# Patient Record
Sex: Female | Born: 1950 | Race: Black or African American | Hispanic: No | Marital: Married | Smoking: Never smoker
Health system: Southern US, Community
[De-identification: ages and names within clinical notes are randomized; demographics above are authoritative.]

## PROBLEM LIST (undated history)

## (undated) DIAGNOSIS — T7840XA Allergy, unspecified, initial encounter: Secondary | ICD-10-CM

## (undated) DIAGNOSIS — J189 Pneumonia, unspecified organism: Secondary | ICD-10-CM

## (undated) DIAGNOSIS — N200 Calculus of kidney: Secondary | ICD-10-CM

## (undated) DIAGNOSIS — R519 Headache, unspecified: Secondary | ICD-10-CM

## (undated) DIAGNOSIS — M1990 Unspecified osteoarthritis, unspecified site: Secondary | ICD-10-CM

## (undated) DIAGNOSIS — G8929 Other chronic pain: Secondary | ICD-10-CM

## (undated) DIAGNOSIS — D649 Anemia, unspecified: Secondary | ICD-10-CM

## (undated) DIAGNOSIS — I10 Essential (primary) hypertension: Secondary | ICD-10-CM

## (undated) HISTORY — DX: Allergy, unspecified, initial encounter: T78.40XA

## (undated) HISTORY — DX: Anemia, unspecified: D64.9

## (undated) HISTORY — DX: Headache, unspecified: R51.9

## (undated) HISTORY — DX: Other chronic pain: G89.29

## (undated) HISTORY — DX: Essential (primary) hypertension: I10

## (undated) HISTORY — DX: Pneumonia, unspecified organism: J18.9

## (undated) HISTORY — DX: Unspecified osteoarthritis, unspecified site: M19.90

## (undated) HISTORY — DX: Calculus of kidney: N20.0

## (undated) HISTORY — PX: COLONOSCOPY: SHX174

---

## 2001-04-18 ENCOUNTER — Encounter: Payer: Self-pay | Admitting: Internal Medicine

## 2003-06-10 HISTORY — PX: KIDNEY STONE SURGERY: SHX686

## 2003-07-11 HISTORY — PX: MYOMECTOMY: SHX85

## 2003-08-28 ENCOUNTER — Other Ambulatory Visit: Payer: Self-pay

## 2005-06-28 ENCOUNTER — Ambulatory Visit: Payer: Self-pay | Admitting: Internal Medicine

## 2005-07-28 ENCOUNTER — Ambulatory Visit: Payer: Self-pay | Admitting: Internal Medicine

## 2005-07-28 ENCOUNTER — Other Ambulatory Visit: Admission: RE | Admit: 2005-07-28 | Discharge: 2005-07-28 | Payer: Self-pay | Admitting: Internal Medicine

## 2005-08-10 ENCOUNTER — Ambulatory Visit: Payer: Self-pay | Admitting: Internal Medicine

## 2005-09-29 ENCOUNTER — Ambulatory Visit: Payer: Self-pay | Admitting: Internal Medicine

## 2006-04-18 ENCOUNTER — Ambulatory Visit: Payer: Self-pay | Admitting: Internal Medicine

## 2006-08-01 ENCOUNTER — Ambulatory Visit: Payer: Self-pay | Admitting: Internal Medicine

## 2006-08-01 LAB — CONVERTED CEMR LAB
Albumin: 3.8 g/dL (ref 3.5–5.2)
BUN: 10 mg/dL (ref 6–23)
CO2: 28 meq/L (ref 19–32)
Calcium: 9.2 mg/dL (ref 8.4–10.5)
Chloride: 105 meq/L (ref 96–112)
Creatinine, Ser: 0.9 mg/dL (ref 0.4–1.2)
GFR calc Af Amer: 84 mL/min
GFR calc non Af Amer: 70 mL/min
Glucose, Bld: 90 mg/dL (ref 70–99)
Phosphorus: 3.5 mg/dL (ref 2.3–4.6)
Potassium: 3.9 meq/L (ref 3.5–5.1)
Sodium: 139 meq/L (ref 135–145)
TSH: 1.13 microintl units/mL (ref 0.35–5.50)

## 2006-08-14 ENCOUNTER — Ambulatory Visit: Payer: Self-pay | Admitting: Internal Medicine

## 2006-10-17 ENCOUNTER — Ambulatory Visit: Payer: Self-pay | Admitting: Internal Medicine

## 2006-10-17 LAB — HM MAMMOGRAPHY: HM Mammogram: NORMAL

## 2007-04-15 ENCOUNTER — Ambulatory Visit: Payer: Self-pay | Admitting: Internal Medicine

## 2007-04-15 DIAGNOSIS — J309 Allergic rhinitis, unspecified: Secondary | ICD-10-CM | POA: Insufficient documentation

## 2007-04-15 DIAGNOSIS — J4520 Mild intermittent asthma, uncomplicated: Secondary | ICD-10-CM

## 2007-04-16 ENCOUNTER — Telehealth (INDEPENDENT_AMBULATORY_CARE_PROVIDER_SITE_OTHER): Payer: Self-pay | Admitting: *Deleted

## 2007-05-22 ENCOUNTER — Ambulatory Visit: Payer: Self-pay | Admitting: Internal Medicine

## 2007-09-19 ENCOUNTER — Encounter: Payer: Self-pay | Admitting: Internal Medicine

## 2007-09-25 ENCOUNTER — Ambulatory Visit: Payer: Self-pay | Admitting: Internal Medicine

## 2007-09-26 LAB — CONVERTED CEMR LAB: Glucose, Bld: 93 mg/dL (ref 70–99)

## 2007-10-11 ENCOUNTER — Ambulatory Visit: Payer: Self-pay | Admitting: Internal Medicine

## 2007-10-11 LAB — CONVERTED CEMR LAB
OCCULT 1: NEGATIVE
OCCULT 3: NEGATIVE

## 2007-10-14 ENCOUNTER — Encounter (INDEPENDENT_AMBULATORY_CARE_PROVIDER_SITE_OTHER): Payer: Self-pay | Admitting: *Deleted

## 2007-10-16 ENCOUNTER — Encounter: Payer: Self-pay | Admitting: Internal Medicine

## 2007-10-24 ENCOUNTER — Encounter (INDEPENDENT_AMBULATORY_CARE_PROVIDER_SITE_OTHER): Payer: Self-pay | Admitting: *Deleted

## 2008-10-26 ENCOUNTER — Encounter: Payer: Self-pay | Admitting: Internal Medicine

## 2008-10-26 ENCOUNTER — Other Ambulatory Visit: Admission: RE | Admit: 2008-10-26 | Discharge: 2008-10-26 | Payer: Self-pay | Admitting: Internal Medicine

## 2008-10-26 ENCOUNTER — Ambulatory Visit: Payer: Self-pay | Admitting: Internal Medicine

## 2008-10-26 DIAGNOSIS — R079 Chest pain, unspecified: Secondary | ICD-10-CM

## 2008-10-30 ENCOUNTER — Encounter: Payer: Self-pay | Admitting: Internal Medicine

## 2008-11-03 ENCOUNTER — Ambulatory Visit: Payer: Self-pay | Admitting: Internal Medicine

## 2008-11-11 ENCOUNTER — Encounter: Payer: Self-pay | Admitting: Internal Medicine

## 2008-11-16 ENCOUNTER — Encounter: Payer: Self-pay | Admitting: Internal Medicine

## 2009-05-18 ENCOUNTER — Ambulatory Visit: Payer: Self-pay | Admitting: Internal Medicine

## 2009-08-11 ENCOUNTER — Ambulatory Visit: Payer: Self-pay | Admitting: Internal Medicine

## 2009-08-11 DIAGNOSIS — I10 Essential (primary) hypertension: Secondary | ICD-10-CM

## 2009-08-25 ENCOUNTER — Ambulatory Visit: Payer: Self-pay | Admitting: Cardiology

## 2009-08-25 ENCOUNTER — Ambulatory Visit: Payer: Self-pay

## 2009-08-25 ENCOUNTER — Ambulatory Visit (HOSPITAL_COMMUNITY): Admission: RE | Admit: 2009-08-25 | Discharge: 2009-08-25 | Payer: Self-pay | Admitting: Internal Medicine

## 2009-08-25 ENCOUNTER — Encounter: Payer: Self-pay | Admitting: Internal Medicine

## 2009-09-01 ENCOUNTER — Ambulatory Visit: Payer: Self-pay | Admitting: Internal Medicine

## 2009-09-02 LAB — CONVERTED CEMR LAB
Creatinine, Ser: 0.9 mg/dL (ref 0.4–1.2)
GFR calc non Af Amer: 83.02 mL/min (ref >60)
Glucose, Bld: 84 mg/dL (ref 70–99)
Sodium: 138 meq/L (ref 135–145)

## 2009-11-10 ENCOUNTER — Ambulatory Visit: Payer: Self-pay | Admitting: Internal Medicine

## 2009-11-10 DIAGNOSIS — J019 Acute sinusitis, unspecified: Secondary | ICD-10-CM

## 2009-11-10 DIAGNOSIS — L570 Actinic keratosis: Secondary | ICD-10-CM

## 2009-11-15 ENCOUNTER — Telehealth: Payer: Self-pay | Admitting: Internal Medicine

## 2009-11-24 ENCOUNTER — Ambulatory Visit: Payer: Self-pay | Admitting: Internal Medicine

## 2009-11-25 ENCOUNTER — Encounter: Payer: Self-pay | Admitting: Internal Medicine

## 2009-11-25 LAB — CONVERTED CEMR LAB: Fecal Occult Bld: NEGATIVE

## 2010-04-20 ENCOUNTER — Ambulatory Visit: Payer: Self-pay | Admitting: Internal Medicine

## 2010-08-07 LAB — CONVERTED CEMR LAB
ALT: 16 units/L (ref 0–35)
Albumin: 4.2 g/dL (ref 3.5–5.2)
Alkaline Phosphatase: 82 units/L (ref 39–117)
Alkaline Phosphatase: 87 units/L (ref 39–117)
BUN: 11 mg/dL (ref 6–23)
Basophils Absolute: 0 10*3/uL (ref 0.0–0.1)
Basophils Relative: 0.5 % (ref 0.0–3.0)
Bilirubin, Direct: 0 mg/dL (ref 0.0–0.3)
Bilirubin, Direct: 0.1 mg/dL (ref 0.0–0.3)
CO2: 28 meq/L (ref 19–32)
CO2: 31 meq/L (ref 19–32)
Calcium: 9.2 mg/dL (ref 8.4–10.5)
Calcium: 9.4 mg/dL (ref 8.4–10.5)
Chloride: 106 meq/L (ref 96–112)
Creatinine, Ser: 0.8 mg/dL (ref 0.4–1.2)
Creatinine,U: 144.9 mg/dL
Direct LDL: 128.7 mg/dL
Eosinophils Absolute: 0.1 10*3/uL (ref 0.0–0.7)
Eosinophils Relative: 4.4 % (ref 0.0–5.0)
Glucose, Bld: 76 mg/dL (ref 70–99)
Glucose, Bld: 84 mg/dL (ref 70–99)
Hemoglobin: 12.9 g/dL (ref 12.0–15.0)
Lymphocytes Relative: 30.4 % (ref 12.0–46.0)
MCHC: 34 g/dL (ref 30.0–36.0)
MCV: 79.4 fL (ref 78.0–100.0)
MCV: 83.1 fL (ref 78.0–100.0)
Microalb, Ur: 1 mg/dL (ref 0.0–1.9)
Monocytes Absolute: 0.2 10*3/uL (ref 0.1–1.0)
Monocytes Absolute: 0.2 10*3/uL (ref 0.1–1.0)
Neutro Abs: 1.7 10*3/uL (ref 1.4–7.7)
Neutrophils Relative %: 43.6 % (ref 43.0–77.0)
Neutrophils Relative %: 63.5 % (ref 43.0–77.0)
Pap Smear: NORMAL
Platelets: 170 10*3/uL (ref 150.0–400.0)
Potassium: 3.9 meq/L (ref 3.5–5.1)
RBC: 4.74 M/uL (ref 3.87–5.11)
Sodium: 142 meq/L (ref 135–145)
Sodium: 143 meq/L (ref 135–145)
TSH: 0.96 microintl units/mL (ref 0.35–5.50)
Total Bilirubin: 0.7 mg/dL (ref 0.3–1.2)
Total Protein: 8.3 g/dL (ref 6.0–8.3)
Triglycerides: 117 mg/dL (ref 0.0–149.0)
WBC: 3.9 10*3/uL — ABNORMAL LOW (ref 4.5–10.5)

## 2010-08-11 NOTE — Assessment & Plan Note (Signed)
Summary: 6 M F/U DLO   Vital Signs:  Patient profile:   60 year old female Weight:      214 pounds Temp:     98.1 degrees F oral Pulse rate:   80 / minute Pulse rhythm:   regular BP sitting:   112 / 80  (right arm) Cuff size:   large  Vitals Entered By: Lowella Petties CMA (April 20, 2010 10:47 AM) CC: 6 month follow up   History of Present Illness: Feels okay  Has had increased allergy symptoms due to ragweed Now uses both antihistamines and nasal spray this controls the ear and nasal congestion  Hasn't had problems with asthma Occ wheezing and chest aching when the weather changes Generally does okay when she gets back into the warmth hasn't needed the resuce inhaler  checks BP occ Last time 116/85 HR was 109---while working Occ headaches--relates to allergies No chest pain  No SOB  Allergies: 1)  ! Penicillin 2)  ! Ampicillin 3)  ! * Eryc  Past History:  Past medical, surgical, family and social histories (including risk factors) reviewed for relevance to current acute and chronic problems.  Past Medical History: Reviewed history from 08/11/2009 and no changes required. Allergic rhinitis Asthma Hypertension  Past Surgical History: Reviewed history from 09/19/2007 and no changes required. C-section Uterine fibroids removed ([facility]) 2005 Kidney stones 12/04  Family History: Reviewed history from 10/26/2008 and no changes required. Dad died in late 70's CHF Mom with dementia, HTN, DM, ?breast cancer  Social History: Reviewed history from 09/25/2007 and no changes required. Marital Status: Married Children: One daughter works at [employer] now  Never Smoked Alcohol use-no  Review of Systems       generally sleeps okay--occ up with mom appetite fine weight down 3#  Physical Exam  General:  alert and normal appearance.   Nose:  moderate pale turbinate congestion Mouth:  no erythema and no exudates.   Neck:  supple, no masses, no  thyromegaly, no carotid bruits, and no cervical lymphadenopathy.   Lungs:  normal respiratory effort, no intercostal retractions, no accessory muscle use, normal breath sounds, no crackles, and no wheezes.   Heart:  normal rate, regular rhythm, no murmur, and no gallop.   Extremities:  no edema Psych:  normally interactive, good eye contact, not anxious appearing, and not depressed appearing.     Impression & Recommendations:  Problem # 1:  HYPERTENSION (ICD-401.9) Assessment Unchanged good control no changes  Her updated medication list for this problem includes:    Triamterene-hctz 37.5-25 Mg Tabs (Triamterene-hctz) .Marland Kitchen... 1 tab daily for high blood pressure  BP today: 112/80 Prior BP: 130/80 (11/10/2009)  Labs Reviewed: K+: 4.1 (09/01/2009) Creat: : 0.9 (09/01/2009)   Chol: 205 (08/11/2009)   HDL: 60.30 (08/11/2009)   TG: 117.0 (08/11/2009)  Problem # 2:  ASTHMA (ICD-493.90) Assessment: Unchanged quiet despite the allergy season  Her updated medication list for this problem includes:    Albuterol 90 Mcg/act Aers (Albuterol) .Marland Kitchen... As needed  Problem # 3:  ALLERGIC RHINITIS (ICD-477.9) Assessment: Unchanged doing okay on  the regimen continue for now then back off after season ends  Her updated medication list for this problem includes:    Cetirizine Hcl 10 Mg Tabs (Cetirizine hcl) .Marland Kitchen... Take 2 by mouth once daily    Fluticasone Propionate 50 Mcg/act Susp (Fluticasone propionate) .Marland Kitchen... 2 sprays in each nostril daily    Loratadine 10 Mg Tabs (Loratadine) .Marland Kitchen... Take 1 by mouth once daily  Complete Medication List: 1)  Triamterene-hctz 37.5-25 Mg Tabs (Triamterene-hctz) .Marland Kitchen.. 1 tab daily for high blood pressure 2)  Albuterol 90 Mcg/act Aers (Albuterol) .... As needed 3)  Cetirizine Hcl 10 Mg Tabs (Cetirizine hcl) .... Take 2 by mouth once daily 4)  Fluticasone Propionate 50 Mcg/act Susp (Fluticasone propionate) .... 2 sprays in each nostril daily 5)  Loratadine 10 Mg Tabs  (Loratadine) .... Take 1 by mouth once daily 6)  Stool Softener 100 Mg Caps (Docusate sodium) .... As needed 7)  Motrin Ib 200 Mg Tabs (Ibuprofen) .... As needed 8)  Aspirin 81 Mg Tabs (Aspirin) .... Take 1 by mouth once every other day  Other Orders: Admin 1st Vaccine (04540) Flu Vaccine 20yrs + (98119)  Patient Instructions: 1)  Please schedule a follow-up appointment in 6 months for physical  Prior Medications: TRIAMTERENE-HCTZ 37.5-25 MG TABS (TRIAMTERENE-HCTZ) 1 tab daily for high blood pressure ALBUTEROL 90 MCG/ACT  AERS (ALBUTEROL) as needed CETIRIZINE HCL 10 MG TABS (CETIRIZINE HCL) take 2 by mouth once daily FLUTICASONE PROPIONATE 50 MCG/ACT SUSP (FLUTICASONE PROPIONATE) 2 sprays in each nostril daily LORATADINE 10 MG TABS (LORATADINE) take 1 by mouth once daily STOOL SOFTENER 100 MG CAPS (DOCUSATE SODIUM) as needed MOTRIN IB 200 MG TABS (IBUPROFEN) as needed ASPIRIN 81 MG TABS (ASPIRIN) take 1 by mouth once every other day Current Allergies: ! PENICILLIN ! AMPICILLIN ! [facility]         Flu Vaccine Consent Questions     Do you have a history of severe allergic reactions to this vaccine? no    Any prior history of allergic reactions to egg and/or gelatin? no    Do you have a sensitivity to the preservative Thimersol? no    Do you have a past history of Guillan-Barre Syndrome? no    Do you currently have an acute febrile illness? no    Have you ever had a severe reaction to latex? no    Vaccine information given and explained to patient? yes    Are you currently pregnant? no    Lot Number:[id number]   Exp Date:01/07/2011   Site Given  Left Deltoid IMbflu

## 2010-08-11 NOTE — Letter (Signed)
Summary: [facility]: Immunoassay Fecal Occult Blood (iFOB) Order Form  [name] [facility]  [address] [place], [place] [address]   Phone: [phone]  Fax: [phone]      [facility]: Immunoassay Fecal Occult Blood (iFOB) Order Form   Nov 10, 2009 MRN: [id number]   Madison Stout 1951/06/21   Physicican Name:__________[name]_______________  Diagnosis Code:__________V76.51________________      Cindee Salt MD

## 2010-08-11 NOTE — Assessment & Plan Note (Signed)
Summary: 3 wk f/u dlo   Vital Signs:  Patient profile:   60 year old female Weight:      217 pounds Temp:     98.1 degrees F oral Pulse rate:   72 / minute Pulse rhythm:   regular BP sitting:   140 / 82  (left arm) Cuff size:   large  Vitals Entered By: Mervin Hack CMA Duncan Dull) (September 01, 2009 11:46 AM) CC: 3 week follow-up   History of Present Illness: Done okay with BP Occ lightheadedness when up and busy No further CP reassuring stress echo  BP has been better  Down to 120/80 and similar numbers  Headaches are better but still come on  Allergies: 1)  ! Penicillin 2)  ! Ampicillin 3)  ! * Eryc  Past History:  Past medical, surgical, family and social histories (including risk factors) reviewed for relevance to current acute and chronic problems.  Past Medical History: Reviewed history from 08/11/2009 and no changes required. Allergic rhinitis Asthma Hypertension  Past Surgical History: Reviewed history from 09/19/2007 and no changes required. C-section Uterine fibroids removed ([facility]) 2005 Kidney stones 12/04  Family History: Reviewed history from 10/26/2008 and no changes required. Dad died in late 88's CHF Mom with dementia, HTN, DM, ?breast cancer  Social History: Reviewed history from 09/25/2007 and no changes required. Marital Status: Married Children: One daughter works at [employer] now  Never Smoked Alcohol use-no  Review of Systems       does go to void more often weight stable sleeps about the same may actually have a little more energy some cramps in toes  Physical Exam  General:  alert and normal appearance.   Neck:  supple, no masses, and no thyromegaly.   Lungs:  normal respiratory effort and normal breath sounds.   Heart:  normal rate, regular rhythm, no murmur, and no gallop.   Extremities:  no edema   Impression & Recommendations:  Problem # 1:  HYPERTENSION (ICD-401.9) Assessment Improved  BP better at  home no sig headaches chest pain has no recurred and stress echo normal  Will continue med check renal today  Her updated medication list for this problem includes:    Triamterene-hctz 37.5-25 Mg Tabs (Triamterene-hctz) .Marland Kitchen... 1 tab daily for high blood pressure  BP today: 140/82 Prior BP: 140/80 (08/11/2009)  Labs Reviewed: K+: 3.9 (08/11/2009) Creat: : 0.9 (08/11/2009)   Chol: 205 (08/11/2009)   HDL: 60.30 (08/11/2009)   TG: 117.0 (08/11/2009)  Orders: TLB-Renal Function Panel (80069-RENAL) Venipuncture (16109)  Complete Medication List: 1)  Albuterol 90 Mcg/act Aers (Albuterol) .... As needed 2)  Cetirizine Hcl 10 Mg Tabs (Cetirizine hcl) .... Take 2 by mouth once daily 3)  Loratadine 10 Mg Tabs (Loratadine) .... Take 1 by mouth once daily 4)  Stool Softener 100 Mg Caps (Docusate sodium) .... As needed 5)  Motrin Ib 200 Mg Tabs (Ibuprofen) .... As needed 6)  Triamterene-hctz 37.5-25 Mg Tabs (Triamterene-hctz) .Marland Kitchen.. 1 tab daily for high blood pressure 7)  Aspirin 81 Mg Tabs (Aspirin) .... Take 1 by mouth once daily  Patient Instructions: 1)  Please keep May 4th physical appt  Current Allergies (reviewed today): ! PENICILLIN ! AMPICILLIN ! [facility]

## 2010-08-11 NOTE — Assessment & Plan Note (Signed)
Summary: HIGH BP DLO   Vital Signs:  Patient profile:   60 year old female Weight:      217 pounds BMI:     34.11 Temp:     98.3 degrees F oral Pulse rate:   80 / minute Pulse rhythm:   regular BP sitting:   140 / 80  (left arm) Cuff size:   large  Vitals Entered By: Mervin Hack CMA Duncan Dull) (August 11, 2009 12:49 PM)  Serial Vital Signs/Assessments:  Time      Position  BP       Pulse  Resp  Temp     By           R Arm     158/80                         DeShannon Smith CMA (AAMA)  CC: headache, rash, blood pressure   History of Present Illness: Got BP check at [facility]  220/140 the first time by person there Repeat was 164/85 January 14th  141/82 at [facility] machine since then  Has headache most days Some back pain when turns in bed Occ chest pain, slight SOB when rushing Feels gas, "like something different" Does note heart racing at times--with exertion Notes increased exertional dyspnea--even walking out to mailbox  Allergies: 1)  ! Penicillin 2)  ! Ampicillin 3)  ! * Eryc  Past History:  Past medical, surgical, family and social histories (including risk factors) reviewed for relevance to current acute and chronic problems.  Past Medical History: Allergic rhinitis Asthma Hypertension  Past Surgical History: Reviewed history from 09/19/2007 and no changes required. C-section Uterine fibroids removed ([facility]) 2005 Kidney stones 12/04  Family History: Reviewed history from 10/26/2008 and no changes required. Dad died in late 22's CHF Mom with dementia, HTN, DM, ?breast cancer  Social History: Reviewed history from 09/25/2007 and no changes required. Marital Status: Married Children: One daughter works at [employer] now  Never Smoked Alcohol use-no  Review of Systems       No edema Sleeps fair Mood has been okay  Physical Exam  General:  alert and normal appearance.   Neck:  supple, no masses, no thyromegaly, no carotid bruits, and  no cervical lymphadenopathy.   Lungs:  normal respiratory effort, no intercostal retractions, no accessory muscle use, and normal breath sounds.   Heart:  normal rate, regular rhythm, no murmur, and no gallop.   Abdomen:  soft and non-tender.   Pulses:  1+ in feet Extremities:  no edema Psych:  normally interactive, good eye contact, not anxious appearing, and not depressed appearing.     Impression & Recommendations:  Problem # 1:  CHEST PAIN (ICD-786.50) Assessment Comment Only  current symptoms very worrisome for CAD will set up stress echo and send to cardiologist if abnormal may just be deconditioning start ASA at least until negative test  Orders: TLB-Renal Function Panel (80069-RENAL) TLB-CBC Platelet - w/Differential (85025-CBCD) TLB-Hepatic/Liver Function Pnl (80076-HEPATIC) TLB-TSH (Thyroid Stimulating Hormone) (84443-TSH) Venipuncture (40347) TLB-Lipid Panel (80061-LIPID) Echo Referral (Echo)  Problem # 2:  HYPERTENSION (ICD-401.9) Assessment: New  esp high in pharmacy but she was stressed ongoing headache  will start Rx check labs  BP today: 140/80 Prior BP: 136/78 (10/26/2008)  Labs Reviewed: K+: 3.6 (10/26/2008) Creat: : 0.8 (10/26/2008)     Her updated medication list for this problem includes:    Triamterene-hctz 37.5-25 Mg Tabs (Triamterene-hctz) .Marland Kitchen... 1 tab daily  for high blood pressure  Orders: TLB-Microalbumin/Creat Ratio, Urine (82043-MALB)  Complete Medication List: 1)  Fluticasone Propionate 50 Mcg/act Susp (Fluticasone propionate) .... 2 sprays each nostril daily 2)  Albuterol 90 Mcg/act Aers (Albuterol) .... As needed 3)  Cetirizine Hcl 10 Mg Tabs (Cetirizine hcl) .... Take 2 by mouth once daily 4)  Loratadine 10 Mg Tabs (Loratadine) .... Take 1 by mouth once daily 5)  Stool Softener 100 Mg Caps (Docusate sodium) .... As needed 6)  Motrin Ib 200 Mg Tabs (Ibuprofen) .... As needed 7)  Triamterene-hctz 37.5-25 Mg Tabs (Triamterene-hctz)  .Marland Kitchen.. 1 tab daily for high blood pressure  Patient Instructions: 1)  Please schedule a follow-up appointment in 3-4  weeks.  2)  Please set up stress echo Prescriptions: TRIAMTERENE-HCTZ 37.5-25 MG TABS (TRIAMTERENE-HCTZ) 1 tab daily for high blood pressure  #30 x 12   Entered and Authorized by:   Cindee Salt MD   Signed by:   Cindee Salt MD on 08/11/2009   Method used:   Electronically to        [facility]* (retail)       [address] [place], [place]  [address]       Ph: [phone]       Fax: [phone]   RxID:   [id number]   Current Allergies (reviewed today): ! PENICILLIN ! AMPICILLIN ! [facility]   EKG  Procedure date:  08/11/2009  Findings:      sinus @75  normal

## 2010-08-11 NOTE — Progress Notes (Signed)
Summary: Heart burn and chest pain  Phone Note Call from Patient Call back at Work Phone [phone]   Caller: Patient Call For: Madison Salt MD Summary of Call: Patient says that she has been having heart burn and chest pain since starting Clindamycin.  The chest pain and heart burn started on Saturday and she says it gets worse during the night.  She wants to know should she stop the medication, start a different medication or what to do.  Please advise.  Uses [employer]. Initial call taken by: Linde Gillis CMA Duncan Dull),  Nov 15, 2009 8:07 AM  Follow-up for Phone Call        have her stop medicine If sinus symptoms better, try without another med If still having problems willl switch to cefuroxime 250mg  two times a day  (make sure she didn't have life threatening reaction to penicillin) #14 x 0 Follow-up by: Madison Salt MD,  Nov 15, 2009 1:28 PM  Additional Follow-up for Phone Call Additional follow up Details #1::        Patient advised as instructed.  She says that she does still have symptoms.  Her chest still hurts and she hasn't taken the Clindamycin since last night.  She says that Penicillin causes a severe rash, hives and itching and she is not willing to take it.  Please advise.  Linde Gillis CMA Duncan Dull)  Nov 15, 2009 4:30 PM   THe cefuroxime is not penicillin but may have 2-3% chance of cross reaction Okay to use this med Madison Salt MD  Nov 15, 2009 5:42 PM   spoke with patient and advised results, pt states as long as she doesn't get the hives she will try it. Rx sent to pharmacy Additional Follow-up by: DeShannon Katrinka Blazing CMA Duncan Dull),  Nov 15, 2009 5:54 PM    New/Updated Medications: CEFUROXIME AXETIL 250 MG TABS (CEFUROXIME AXETIL) take 1 by mouth two times a day for 7 days Prescriptions: CEFUROXIME AXETIL 250 MG TABS (CEFUROXIME AXETIL) take 1 by mouth two times a day for 7 days  #14 x 0   Entered by:   Mervin Hack CMA (AAMA)   Authorized by:    Madison Salt MD   Signed by:   Mervin Hack CMA (AAMA) on 11/15/2009   Method used:   Electronically to        [facility]* (retail)       [address] [place], [place]  [address]       Ph: [phone]       Fax: [phone]   RxID:   [id number]

## 2010-08-11 NOTE — Letter (Signed)
Summary: Results Follow up Letter  [place] [facility]  [address] [place], [place] [address]   Phone: [phone]  Fax: [phone]    11/25/2009 MRN: [id number]  [facility] [place] [address] [place], [place]  [address]  Dear Ms. Deatley,  The following are the results of your recent test(s):  Test         Result    Pap Smear:        Normal _____  Not Normal _____ Comments: ______________________________________________________ Cholesterol: LDL(Bad cholesterol):         Your goal is less than:         HDL (Good cholesterol):       Your goal is more than: Comments:  ______________________________________________________ Mammogram:        Normal _____  Not Normal _____ Comments:  ___________________________________________________________________ Hemoccult:        Normal __X___  Not normal _______ Comments:  Stool test negative for blood, we will recheck this again next year.   _____________________________________________________________________ Other Tests:    We routinely do not discuss normal results over the telephone.  If you desire a copy of the results, or you have any questions about this information we can discuss them at your next office visit.   Sincerely,      Tillman Abide, MD

## 2010-08-11 NOTE — Assessment & Plan Note (Signed)
Summary: CPX/CLE   Vital Signs:  Patient profile:   60 year old female Weight:      217 pounds Temp:     98.0 degrees F oral Pulse rate:   68 / minute Pulse rhythm:   regular BP sitting:   130 / 80  (left arm) Cuff size:   regular  Vitals Entered By: Mervin Hack CMA Duncan Dull) (Nov 10, 2009 9:42 AM) CC: adult physical   History of Present Illness: Having sinus symptoms Lots of drainage Ear pressure  and headache cough with occ thick mucus taste and smell off gets bad taste in mouth no fever no SOB  Some easy bruising recently discussed decreasing ASA to every other day     Allergies: 1)  ! Penicillin 2)  ! Ampicillin 3)  ! * Eryc  Past History:  Past medical, surgical, family and social histories (including risk factors) reviewed for relevance to current acute and chronic problems.  Past Medical History: Reviewed history from 08/11/2009 and no changes required. Allergic rhinitis Asthma Hypertension  Past Surgical History: Reviewed history from 09/19/2007 and no changes required. C-section Uterine fibroids removed ([facility]) 2005 Kidney stones 12/04  Family History: Reviewed history from 10/26/2008 and no changes required. Dad died in late 7's CHF Mom with dementia, HTN, DM, ?breast cancer  Social History: Reviewed history from 09/25/2007 and no changes required. Marital Status: Married Children: One daughter works at [employer] now  Never Smoked Alcohol use-no  Review of Systems General:  weight stable sleep problems due to mom's care wears seat belt. Eyes:  Complains of blurring; denies double vision and vision loss-1 eye; due for eye exam. ENT:  Complains of decreased hearing; denies ringing in ears; some trouble on left--worse with current illness teeth okay--regular with dentist. CV:  Denies chest pain or discomfort, difficulty breathing at night, difficulty breathing while lying down, fainting, lightheadness, and shortness of breath with  exertion; Occ walks. Resp:  Complains of cough; denies shortness of breath; only allergy cough. GI:  Complains of constipation; denies bloody stools, change in bowel habits, dark tarry stools, indigestion, nausea, and vomiting; takes stool softener. GU:  Denies dysuria and incontinence; no sexual problems . MS:  Denies joint pain and joint swelling. Derm:  Denies lesion(s) and rash. Neuro:  Complains of weakness; denies headaches, numbness, and tingling; occ weak feeling in legs around lunch time---generally short lived only sinus headache when sick (like now). Psych:  Denies anxiety and depression. Heme:  See HPI; Complains of abnormal bruising; denies enlarge lymph nodes. Allergy:  Complains of seasonal allergies and sneezing.  Physical Exam  General:  alert and normal appearance.   Head:  no sinus tenderness but pain in right frontal area Eyes:  pupils equal, pupils round, pupils reactive to light, and no optic disk abnormalities.   Ears:  cerumen bilaterally Nose:  marked, mostly pale congestion Mouth:  no erythema, no exudates, and no lesions.   Neck:  supple, no masses, no thyromegaly, no carotid bruits, and no cervical lymphadenopathy.   Breasts:  no abnormal thickening, no tenderness, and no adenopathy.  Mild cystic changes R>L Lungs:  normal respiratory effort and normal breath sounds.   Heart:  normal rate, regular rhythm, no murmur, and no gallop.   Abdomen:  soft and non-tender.   Msk:  no joint tenderness and no joint swelling.   Pulses:  1+ on feet Extremities:  no edema Neurologic:  alert & oriented X3, strength normal in all extremities, and gait normal.  Skin:  no rashes and no ulcerations.  SMall flaky area posterior to left breast (on mid back) Psych:  normally interactive, good eye contact, not anxious appearing, and not depressed appearing.     Impression & Recommendations:  Problem # 1:  PREVENTIVE HEALTH CARE (ICD-V70.0) Assessment Comment Only will wait  till next year for mammo stool immunoassay given discussed exercise  Problem # 2:  SINUSITIS - ACUTE-NOS (ICD-461.9) Assessment: New  will treat with clinda and restart fluticasone  Her updated medication list for this problem includes:    Clindamycin Hcl 300 Mg Caps (Clindamycin hcl) .Marland Kitchen... 1 tab by mouth three times a day for sinus infection    Fluticasone Propionate 50 Mcg/act Susp (Fluticasone propionate) .Marland Kitchen... 2 sprays in each nostril daily  Problem # 3:  HYPERTENSION (ICD-401.9) Assessment: Unchanged good control no changes needed Her updated medication list for this problem includes:    Triamterene-hctz 37.5-25 Mg Tabs (Triamterene-hctz) .Marland Kitchen... 1 tab daily for high blood pressure  BP today: 130/80 Prior BP: 140/82 (09/01/2009)  Labs Reviewed: K+: 4.1 (09/01/2009) Creat: : 0.9 (09/01/2009)   Chol: 205 (08/11/2009)   HDL: 60.30 (08/11/2009)   TG: 117.0 (08/11/2009)  Problem # 4:  ACTINIC KERATOSIS (ICD-702.0) Assessment: New  treated with liquid nitrogen 40 seconds x 2 discussed home care  Orders: Cryotherapy/Destruction benign or premalignant lesion (1st lesion)  ([address])  Complete Medication List: 1)  Triamterene-hctz 37.5-25 Mg Tabs (Triamterene-hctz) .Marland Kitchen.. 1 tab daily for high blood pressure 2)  Albuterol 90 Mcg/act Aers (Albuterol) .... As needed 3)  Cetirizine Hcl 10 Mg Tabs (Cetirizine hcl) .... Take 2 by mouth once daily 4)  Loratadine 10 Mg Tabs (Loratadine) .... Take 1 by mouth once daily 5)  Stool Softener 100 Mg Caps (Docusate sodium) .... As needed 6)  Motrin Ib 200 Mg Tabs (Ibuprofen) .... As needed 7)  Aspirin 81 Mg Tabs (Aspirin) .... Take 1 by mouth once every other day 8)  Clindamycin Hcl 300 Mg Caps (Clindamycin hcl) .Marland Kitchen.. 1 tab by mouth three times a day for sinus infection 9)  Fluticasone Propionate 50 Mcg/act Susp (Fluticasone propionate) .... 2 sprays in each nostril daily  Patient Instructions: 1)  Please schedule a follow-up appointment in 6  months .  Prescriptions: FLUTICASONE PROPIONATE 50 MCG/ACT SUSP (FLUTICASONE PROPIONATE) 2 sprays in each nostril daily  #1 x 11   Entered and Authorized by:   Cindee Salt MD   Signed by:   Cindee Salt MD on 11/10/2009   Method used:   Electronically to        [facility]* (retail)       [address] [place], [place]  [address]       Ph: [phone]       Fax: [phone]   RxID:   [id number] CLINDAMYCIN HCL 300 MG CAPS (CLINDAMYCIN HCL) 1 tab by mouth three times a day for sinus infection  #30 x 0   Entered and Authorized by:   Cindee Salt MD   Signed by:   Cindee Salt MD on 11/10/2009   Method used:   Electronically to        [facility]* (retail)       [address] [place], [place]  [address]       Ph: [phone]  Fax: [phone]   RxID:   [id number]   Current Allergies (reviewed today): ! PENICILLIN ! AMPICILLIN ! [facility]

## 2010-11-23 ENCOUNTER — Other Ambulatory Visit (INDEPENDENT_AMBULATORY_CARE_PROVIDER_SITE_OTHER): Payer: 59 | Admitting: Internal Medicine

## 2010-11-23 DIAGNOSIS — I10 Essential (primary) hypertension: Secondary | ICD-10-CM

## 2010-11-23 LAB — LIPID PANEL
Total CHOL/HDL Ratio: 4
VLDL: 22.4 mg/dL (ref 0.0–40.0)

## 2010-11-23 LAB — COMPREHENSIVE METABOLIC PANEL
ALT: 16 U/L (ref 0–35)
Albumin: 3.7 g/dL (ref 3.5–5.2)
Alkaline Phosphatase: 64 U/L (ref 39–117)
Glucose, Bld: 81 mg/dL (ref 70–99)
Potassium: 3.7 mEq/L (ref 3.5–5.1)
Sodium: 139 mEq/L (ref 135–145)
Total Bilirubin: 0.7 mg/dL (ref 0.3–1.2)
Total Protein: 7.2 g/dL (ref 6.0–8.3)

## 2010-11-23 LAB — CBC WITH DIFFERENTIAL/PLATELET
Basophils Relative: 0.6 % (ref 0.0–3.0)
Eosinophils Relative: 5.6 % — ABNORMAL HIGH (ref 0.0–5.0)
Lymphocytes Relative: 34.9 % (ref 12.0–46.0)
Neutrophils Relative %: 54.5 % (ref 43.0–77.0)

## 2010-11-23 LAB — TSH: TSH: 0.93 u[IU]/mL (ref 0.35–5.50)

## 2010-11-29 ENCOUNTER — Encounter: Payer: Self-pay | Admitting: Internal Medicine

## 2010-11-30 ENCOUNTER — Encounter: Payer: Self-pay | Admitting: Internal Medicine

## 2010-11-30 ENCOUNTER — Ambulatory Visit (INDEPENDENT_AMBULATORY_CARE_PROVIDER_SITE_OTHER): Payer: 59 | Admitting: Internal Medicine

## 2010-11-30 VITALS — BP 108/72 | HR 89 | Temp 98.2°F | Ht 65.0 in | Wt 212.0 lb

## 2010-11-30 DIAGNOSIS — Z0000 Encounter for general adult medical examination without abnormal findings: Secondary | ICD-10-CM

## 2010-11-30 DIAGNOSIS — J309 Allergic rhinitis, unspecified: Secondary | ICD-10-CM

## 2010-11-30 DIAGNOSIS — J45909 Unspecified asthma, uncomplicated: Secondary | ICD-10-CM

## 2010-11-30 DIAGNOSIS — Z1231 Encounter for screening mammogram for malignant neoplasm of breast: Secondary | ICD-10-CM

## 2010-11-30 DIAGNOSIS — I10 Essential (primary) hypertension: Secondary | ICD-10-CM

## 2010-11-30 NOTE — Patient Instructions (Signed)
Please schedule your mammogram!

## 2010-11-30 NOTE — Progress Notes (Signed)
Subjective:    Patient ID: Madison Stout, female    DOB: 21-Sep-1950, 60 y.o.   MRN: [id number]  HPI Doing well Life is easier since mom in rest home  Notes that hands are asleep when she wakes up Takes 4-5 minutes to come back to normal No pain or weakness No daytime problems Discussed splints or observation  Ongoing pollen issues Uses flonase, claritin and cetirizine  Asthma is quiet Hasn't needed albuterol  Still on BP med  Current outpatient prescriptions:albuterol (PROVENTIL HFA;VENTOLIN HFA) 108 (90 BASE) MCG/ACT inhaler, Inhale 2 puffs into the lungs every 6 (six) hours as needed.  , Disp: , Rfl: ;  aspirin 81 MG tablet, Take 81 mg by mouth daily.  , Disp: , Rfl: ;  cetirizine (ZYRTEC) 10 MG tablet, Take 20 mg by mouth daily.  , Disp: , Rfl: ;  docusate sodium (COLACE) 100 MG capsule, Take 100 mg by mouth 2 (two) times daily as needed.  , Disp: , Rfl:  fluticasone (FLONASE) 50 MCG/ACT nasal spray, 2 sprays by Nasal route daily.  , Disp: , Rfl: ;  ibuprofen (ADVIL,MOTRIN) 200 MG tablet, Take 200 mg by mouth every 6 (six) hours as needed.  , Disp: , Rfl: ;  loratadine (CLARITIN) 10 MG tablet, Take 10 mg by mouth daily.  , Disp: , Rfl: ;  triamterene-hydrochlorothiazide (MAXZIDE-25) 37.5-25 MG per tablet, Take 1 tablet by mouth daily.  , Disp: , Rfl:   Past Medical History  Diagnosis Date  . Allergy   . Asthma   . Hypertension     Past Surgical History  Procedure Date  . Cesarean section   . Myomectomy 2005    removed  . Kidney stone surgery 12/04    Family History  Problem Relation Age of Onset  . Dementia Mother   . Hypertension Mother   . Diabetes Mother   . Cancer Mother     breast    History   Social History  . Marital Status: Married    Spouse Name: N/A    Number of Children: 1  . Years of Education: N/A   Occupational History  . [employer]    Social History Main Topics  . Smoking status: Never Smoker   . Smokeless tobacco: Never Used  . Alcohol  Use: No  . Drug Use: No  . Sexually Active: Not on file   Other Topics Concern  . Not on file   Social History Narrative  . No narrative on file   Review of Systems  Constitutional: Negative for fatigue and unexpected weight change.       Wears seat belt  HENT: Positive for congestion, rhinorrhea and postnasal drip. Negative for hearing loss, dental problem and tinnitus.   Eyes: Negative for visual disturbance.       Cataracts in both eyes Mild glare in right eye sometimes No diplopia  Respiratory: Negative for cough, chest tightness, shortness of breath and wheezing.   Cardiovascular: Negative for chest pain, palpitations and leg swelling.  Gastrointestinal: Positive for abdominal pain. Negative for nausea, vomiting and blood in stool.       Indigestion and pain if she eats the wrong foods. Occ diarrhea then and that relieves the pain  Genitourinary: Negative for urgency, difficulty urinating and dyspareunia.       No sexual problems  Self breast exam fine  Musculoskeletal: Positive for arthralgias. Negative for back pain and joint swelling.       Occ knee gives out  and has aching in legs Arthritis pain reliever helps---acetaminophen Fish oil also helps  Skin: Negative for rash.       No suspicious lesions  Neurological: Positive for numbness. Negative for dizziness, syncope, weakness, light-headedness and headaches.  Hematological: Negative for adenopathy. Does not bruise/bleed easily.  Psychiatric/Behavioral: Negative for sleep disturbance and dysphoric mood. The patient is not nervous/anxious.        Objective:   Physical Exam  Constitutional: She is oriented to person, place, and time. She appears well-developed and well-nourished. No distress.  HENT:  Head: Normocephalic and atraumatic.  Right Ear: External ear normal.  Left Ear: External ear normal.  Mouth/Throat: Oropharynx is clear and moist. No oropharyngeal exudate.       TMs obscurred with cerumen  Eyes:  Conjunctivae and EOM are normal. Pupils are equal, round, and reactive to light.       Fundi benign  Neck: Normal range of motion. Neck supple. No thyromegaly present.  Cardiovascular: Normal rate, regular rhythm, normal heart sounds and intact distal pulses.  Exam reveals no gallop.   No murmur heard. Pulmonary/Chest: Effort normal and breath sounds normal. No respiratory distress. She has no wheezes. She has no rales.  Abdominal: Soft. She exhibits no mass. There is no tenderness.  Genitourinary:       Breasts are dense with sig bilateral cystic changes No worrisome mass though  Musculoskeletal: Normal range of motion. She exhibits no edema and no tenderness.  Lymphadenopathy:    She has no cervical adenopathy.    She has no axillary adenopathy.  Neurological: She is alert and oriented to person, place, and time. She exhibits normal muscle tone.       No weakness  Skin: Skin is warm. No rash noted.       No suspicious lesions  Psychiatric: She has a normal mood and affect. Her behavior is normal. Judgment and thought content normal.          Assessment & Plan:

## 2010-12-13 ENCOUNTER — Other Ambulatory Visit: Payer: Self-pay | Admitting: Internal Medicine

## 2010-12-13 ENCOUNTER — Other Ambulatory Visit: Payer: 59

## 2010-12-13 DIAGNOSIS — Z1211 Encounter for screening for malignant neoplasm of colon: Secondary | ICD-10-CM

## 2010-12-13 LAB — FECAL OCCULT BLOOD, IMMUNOCHEMICAL: Fecal Occult Bld: NEGATIVE

## 2010-12-14 ENCOUNTER — Encounter: Payer: Self-pay | Admitting: *Deleted

## 2010-12-28 ENCOUNTER — Ambulatory Visit: Payer: Self-pay | Admitting: Internal Medicine

## 2010-12-30 ENCOUNTER — Encounter: Payer: Self-pay | Admitting: *Deleted

## 2011-01-02 ENCOUNTER — Encounter: Payer: Self-pay | Admitting: Internal Medicine

## 2011-03-01 ENCOUNTER — Other Ambulatory Visit: Payer: Self-pay | Admitting: *Deleted

## 2011-03-01 MED ORDER — FLUTICASONE PROPIONATE 50 MCG/ACT NA SUSP: 2.0000 | Freq: Every day | NASAL | Status: DC

## 2011-05-19 ENCOUNTER — Ambulatory Visit (INDEPENDENT_AMBULATORY_CARE_PROVIDER_SITE_OTHER): Payer: 59 | Admitting: *Deleted

## 2011-05-19 DIAGNOSIS — Z23 Encounter for immunization: Secondary | ICD-10-CM

## 2011-06-07 ENCOUNTER — Ambulatory Visit (INDEPENDENT_AMBULATORY_CARE_PROVIDER_SITE_OTHER): Payer: 59 | Admitting: Internal Medicine

## 2011-06-07 ENCOUNTER — Encounter: Payer: Self-pay | Admitting: Internal Medicine

## 2011-06-07 DIAGNOSIS — J309 Allergic rhinitis, unspecified: Secondary | ICD-10-CM

## 2011-06-07 DIAGNOSIS — J45909 Unspecified asthma, uncomplicated: Secondary | ICD-10-CM

## 2011-06-07 DIAGNOSIS — I10 Essential (primary) hypertension: Secondary | ICD-10-CM

## 2011-06-07 MED ORDER — TRIAMTERENE-HCTZ 37.5-25 MG PO TABS: 1.0000 | ORAL_TABLET | Freq: Every day | ORAL | Status: DC

## 2011-06-07 NOTE — Assessment & Plan Note (Signed)
Ongoing symptoms despite regular Rx Discussed singulair---she prefers to wait on this

## 2011-06-07 NOTE — Progress Notes (Signed)
Subjective:    Patient ID: Madison Stout, female    DOB: March 19, 1951, 60 y.o.   MRN: [id number]  HPI Doing okay Back to work regularly--mom had gone to nursing home and then recently passed  Ongoing allergy problems Has year round Intermittent with fluticasone Uses both antihistamines daily  No chest pain occ feels her heart fluttering--notices it mostly with stress. Calms over time. Doesn't occur with exertion. ??related to caffeine No dizziness or SOB No edema  No asthma No wheezing or regular cough Hasn't needed rescue inhaler in some time  Has had pain in right hand Has soreness for some time--?3 months No known injury  Current Outpatient Prescriptions on File Prior to Visit  Medication Sig Dispense Refill  . albuterol (PROVENTIL HFA;VENTOLIN HFA) 108 (90 BASE) MCG/ACT inhaler Inhale 2 puffs into the lungs every 6 (six) hours as needed.        Marland Kitchen aspirin 81 MG tablet Take 81 mg by mouth daily.        . cetirizine (ZYRTEC) 10 MG tablet Take 20 mg by mouth daily.        . fluticasone (FLONASE) 50 MCG/ACT nasal spray Place 2 sprays into the nose daily.  16 g  6  . loratadine (CLARITIN) 10 MG tablet Take 10 mg by mouth daily.        Marland Kitchen triamterene-hydrochlorothiazide (MAXZIDE-25) 37.5-25 MG per tablet Take 1 tablet by mouth daily.          Allergies  Allergen Reactions  . Ampicillin   . Penicillins     Past Medical History  Diagnosis Date  . Allergy   . Asthma   . Hypertension     Past Surgical History  Procedure Date  . Cesarean section   . Myomectomy 2005    removed  . Kidney stone surgery 12/04    Family History  Problem Relation Age of Onset  . Dementia Mother   . Hypertension Mother   . Diabetes Mother   . Cancer Mother     breast    History   Social History  . Marital Status: Married    Spouse Name: N/A    Number of Children: 1  . Years of Education: N/A   Occupational History  . [employer]    Social History Main Topics  . Smoking  status: Never Smoker   . Smokeless tobacco: Never Used  . Alcohol Use: No  . Drug Use: No  . Sexually Active: Not on file   Other Topics Concern  . Not on file   Social History Narrative  . No narrative on file   Review of Systems Lost 8# since last visit--not really trying    Objective:   Physical Exam  Constitutional: She appears well-developed and well-nourished. No distress.  HENT:       Moderate pale nasal congestion  Neck: Normal range of motion. Neck supple. No thyromegaly present.  Cardiovascular: Normal rate, regular rhythm, normal heart sounds and intact distal pulses.  Exam reveals no gallop.   No murmur heard. Pulmonary/Chest: Effort normal and breath sounds normal. No respiratory distress. She has no wheezes. She has no rales.  Musculoskeletal:       Slight scattered tenderness in right hand--- 2nd PIP, proximal phalanx in 4th finger No active synovitis  Lymphadenopathy:    She has no cervical adenopathy.  Neurological:       Normal grip strength  Psychiatric: She has a normal mood and affect. Her behavior is normal. Judgment  and thought content normal.          Assessment & Plan:

## 2011-06-07 NOTE — Assessment & Plan Note (Signed)
Mild and very intermittent Hasn't needed rescue inhaler at all

## 2011-06-07 NOTE — Assessment & Plan Note (Signed)
BP Readings from Last 3 Encounters:  06/07/11 131/82  11/30/10 108/72  04/20/10 112/80   Good control No changes needed

## 2011-09-11 ENCOUNTER — Ambulatory Visit (INDEPENDENT_AMBULATORY_CARE_PROVIDER_SITE_OTHER): Payer: 59 | Admitting: Internal Medicine

## 2011-09-11 ENCOUNTER — Encounter: Payer: Self-pay | Admitting: Internal Medicine

## 2011-09-11 VITALS — BP 120/80 | HR 98 | Temp 98.0°F | Wt 207.0 lb

## 2011-09-11 DIAGNOSIS — J019 Acute sinusitis, unspecified: Secondary | ICD-10-CM

## 2011-09-11 MED ORDER — CEFUROXIME AXETIL 250 MG PO TABS: 250.0000 mg | ORAL_TABLET | Freq: Two times a day (BID) | ORAL | Status: AC

## 2011-09-11 NOTE — Assessment & Plan Note (Signed)
Clearly seems to have bacterial infection Has tolerated ceftin in past despite PCN allergy---will use this again

## 2011-09-11 NOTE — Progress Notes (Signed)
  Subjective:    Patient ID: Madison Stout, female    DOB: 01-Jan-1951, 61 y.o.   MRN: [id number]  HPI Sinus problems for several weeks or a month Now with bad headache in top of head Has used tylenol when worse Entire right side of face hurts now--to ear Right nare blocked----can't even get drops in there  Some cough---seems to be draining in throat. Had chunk of blood once taste and smell are off Hearing is off also  No fever No sputum Not sig SOB  Hasn't needed albuterol Occ dizziness if moves too fast Current Outpatient Prescriptions on File Prior to Visit  Medication Sig Dispense Refill  . albuterol (PROVENTIL HFA;VENTOLIN HFA) 108 (90 BASE) MCG/ACT inhaler Inhale 2 puffs into the lungs every 6 (six) hours as needed.        Marland Kitchen aspirin 81 MG tablet Take 81 mg by mouth daily.        . cetirizine (ZYRTEC) 10 MG tablet Take 20 mg by mouth daily.        . fluticasone (FLONASE) 50 MCG/ACT nasal spray Place 2 sprays into the nose daily.  16 g  6  . loratadine (CLARITIN) 10 MG tablet Take 10 mg by mouth daily.        Marland Kitchen triamterene-hydrochlorothiazide (MAXZIDE-25) 37.5-25 MG per tablet Take 1 each (1 tablet total) by mouth daily.  90 tablet  3    Allergies  Allergen Reactions  . Ampicillin   . Penicillins     Past Medical History  Diagnosis Date  . Allergy   . Asthma   . Hypertension     Past Surgical History  Procedure Date  . Cesarean section   . Myomectomy 2005    removed  . Kidney stone surgery 12/04    Family History  Problem Relation Age of Onset  . Dementia Mother   . Hypertension Mother   . Diabetes Mother   . Cancer Mother     breast    History   Social History  . Marital Status: Married    Spouse Name: N/A    Number of Children: 1  . Years of Education: N/A   Occupational History  . [employer]    Social History Main Topics  . Smoking status: Never Smoker   . Smokeless tobacco: Never Used  . Alcohol Use: No  . Drug Use: No  . Sexually  Active: Not on file   Other Topics Concern  . Not on file   Social History Narrative  . No narrative on file   Review of Systems No vomiting or diarrhea Has tried some mineral oil in ear--heated up. Helps a little No rash     Objective:   Physical Exam  Constitutional: She appears well-developed and well-nourished. No distress.  HENT:  Mouth/Throat: Oropharynx is clear and moist. No oropharyngeal exudate.       Marked right frontal and maxillary tenderness Cerumen fills both canals Marked swelling in right nares, moderate in left  Neck: Normal range of motion.       Tender cervical nodes---mostly on right  Pulmonary/Chest: Effort normal and breath sounds normal. No respiratory distress. She has no wheezes. She has no rales.  Lymphadenopathy:    She has cervical adenopathy.          Assessment & Plan:

## 2011-12-07 ENCOUNTER — Encounter: Payer: Self-pay | Admitting: Internal Medicine

## 2011-12-07 ENCOUNTER — Other Ambulatory Visit (HOSPITAL_COMMUNITY)
Admission: RE | Admit: 2011-12-07 | Discharge: 2011-12-07 | Disposition: A | Discharge status: Home / Self Care | Payer: 59 | Source: Ambulatory Visit | Attending: Internal Medicine | Admitting: Internal Medicine

## 2011-12-07 ENCOUNTER — Ambulatory Visit (INDEPENDENT_AMBULATORY_CARE_PROVIDER_SITE_OTHER): Payer: 59 | Admitting: Internal Medicine

## 2011-12-07 VITALS — BP 122/70 | HR 88 | Temp 98.3°F | Ht 65.0 in | Wt 208.0 lb

## 2011-12-07 DIAGNOSIS — H6120 Impacted cerumen, unspecified ear: Secondary | ICD-10-CM

## 2011-12-07 DIAGNOSIS — I10 Essential (primary) hypertension: Secondary | ICD-10-CM

## 2011-12-07 DIAGNOSIS — Z0000 Encounter for general adult medical examination without abnormal findings: Secondary | ICD-10-CM

## 2011-12-07 DIAGNOSIS — E669 Obesity, unspecified: Secondary | ICD-10-CM

## 2011-12-07 DIAGNOSIS — J309 Allergic rhinitis, unspecified: Secondary | ICD-10-CM

## 2011-12-07 DIAGNOSIS — J45909 Unspecified asthma, uncomplicated: Secondary | ICD-10-CM

## 2011-12-07 DIAGNOSIS — Z01419 Encounter for gynecological examination (general) (routine) without abnormal findings: Secondary | ICD-10-CM | POA: Insufficient documentation

## 2011-12-07 NOTE — Assessment & Plan Note (Signed)
With secondary hearing loss Flushing and OTC meds ineffective Will send to ENT

## 2011-12-07 NOTE — Progress Notes (Signed)
Addended by: Sueanne Margarita on: 12/07/2011 04:16 PM   Modules accepted: Orders

## 2011-12-07 NOTE — Assessment & Plan Note (Signed)
Discussed fitness efforts

## 2011-12-07 NOTE — Assessment & Plan Note (Signed)
BP Readings from Last 3 Encounters:  12/07/11 122/70  09/11/11 120/80  06/07/11 131/82   Good control Due for labs

## 2011-12-07 NOTE — Progress Notes (Signed)
Subjective:    Patient ID: Madison Stout, female    DOB: 1950/07/24, 61 y.o.   MRN: [id number]  HPI Here for physical  Has clogged feeling in left ear more than right Difficulty hearing Feels her allergies are controlled with meds Regular with fluticasone and loratadine (20mg ) and 1 fexofenadine  Asthma is quiet  Hasn't needed inhaler at all  Discussed weight Trying to walk  Current Outpatient Prescriptions on File Prior to Visit  Medication Sig Dispense Refill  . albuterol (PROVENTIL HFA;VENTOLIN HFA) 108 (90 BASE) MCG/ACT inhaler Inhale 2 puffs into the lungs every 6 (six) hours as needed.        Marland Kitchen aspirin 81 MG tablet Take 81 mg by mouth daily.        . cetirizine (ZYRTEC) 10 MG tablet Take 20 mg by mouth daily.        . fluticasone (FLONASE) 50 MCG/ACT nasal spray Place 2 sprays into the nose daily.  16 g  6  . loratadine (CLARITIN) 10 MG tablet Take 10 mg by mouth daily.        Marland Kitchen triamterene-hydrochlorothiazide (MAXZIDE-25) 37.5-25 MG per tablet Take 1 each (1 tablet total) by mouth daily.  90 tablet  3    Allergies  Allergen Reactions  . Clindamycin/Lincomycin     Chest felt tight, breathing off  . Ampicillin   . Penicillins     Past Medical History  Diagnosis Date  . Allergy   . Asthma   . Hypertension     Past Surgical History  Procedure Date  . Cesarean section   . Myomectomy 2005    removed  . Kidney stone surgery 12/04    Family History  Problem Relation Age of Onset  . Dementia Mother   . Hypertension Mother   . Diabetes Mother   . Cancer Mother     breast    History   Social History  . Marital Status: Married    Spouse Name: N/A    Number of Children: 1  . Years of Education: N/A   Occupational History  . [employer]    Social History Main Topics  . Smoking status: Never Smoker   . Smokeless tobacco: Never Used  . Alcohol Use: No  . Drug Use: No  . Sexually Active: Not on file   Other Topics Concern  . Not on file   Social  History Narrative  . No narrative on file   Review of Systems  Constitutional: Negative for fatigue and unexpected weight change.       Wears seat belt  HENT: Positive for hearing loss, congestion, rhinorrhea and tinnitus. Negative for dental problem.        Occ hears "thunder" when no one else hears anything Regular with dentist  Eyes: Negative for visual disturbance.       No diplopia or unilateral vision loss  Respiratory: Positive for cough. Negative for chest tightness and shortness of breath.        Occ cough if gets overheated  Cardiovascular: Negative for chest pain, palpitations and leg swelling.  Gastrointestinal: Positive for constipation. Negative for nausea, vomiting, abdominal pain and blood in stool.       Occ constipation No heartburn  Genitourinary: Positive for urgency. Negative for dysuria, frequency, difficulty urinating and dyspareunia.       Occ urgency if she waits too long No incontinence    Musculoskeletal: Positive for arthralgias. Negative for back pain and joint swelling.  Knee and hip pain---acetaminophen or other OTCs help  Skin: Negative for rash.       No suspicious lesions  Neurological: Positive for numbness and headaches. Negative for dizziness, syncope, weakness and light-headedness.       Occ headaches--tylenol helps if lingers Arms fall asleep at night---positional  Hematological: Negative for adenopathy. Bruises/bleeds easily.       Bruises easy  Psychiatric/Behavioral: Negative for sleep disturbance and dysphoric mood. The patient is not nervous/anxious.        Objective:   Physical Exam  Constitutional: She is oriented to person, place, and time. She appears well-developed and well-nourished. No distress.  HENT:  Head: Normocephalic and atraumatic.  Right Ear: External ear normal.  Left Ear: External ear normal.  Mouth/Throat: Oropharynx is clear and moist. No oropharyngeal exudate.       Impacted cerumen L>R  Eyes: Conjunctivae  and EOM are normal. Pupils are equal, round, and reactive to light.  Neck: Normal range of motion. Neck supple. No thyromegaly present.  Cardiovascular: Normal rate, regular rhythm, normal heart sounds and intact distal pulses.  Exam reveals no gallop.   No murmur heard. Pulmonary/Chest: Effort normal and breath sounds normal. No respiratory distress. She has no wheezes. She has no rales.  Abdominal: Soft. There is no tenderness.  Genitourinary:       Mild cystic changes in breasts without worrisome masses  Normal introitus Cervix normal---pap done Limited bimanual negative  Musculoskeletal: Normal range of motion. She exhibits no edema and no tenderness.  Lymphadenopathy:    She has no cervical adenopathy.    She has no axillary adenopathy.  Neurological: She is alert and oriented to person, place, and time. She exhibits normal muscle tone. Coordination normal.  Skin: No rash noted. No erythema.  Psychiatric: She has a normal mood and affect. Her behavior is normal. Thought content normal.          Assessment & Plan:

## 2011-12-07 NOTE — Assessment & Plan Note (Signed)
Mild and very intermittent Almost never needs inhaler

## 2011-12-07 NOTE — Assessment & Plan Note (Signed)
Okay with current regimen 

## 2011-12-07 NOTE — Assessment & Plan Note (Signed)
Healthy but out of shape Pap done mammo every 2 years--due 2014

## 2011-12-08 LAB — CBC WITH DIFFERENTIAL/PLATELET
Basophils Absolute: 0 10*3/uL (ref 0.0–0.1)
Hemoglobin: 12.6 g/dL (ref 12.0–15.0)
Lymphocytes Relative: 34.1 % (ref 12.0–46.0)
Monocytes Relative: 5 % (ref 3.0–12.0)
Neutro Abs: 2.7 10*3/uL (ref 1.4–7.7)
RBC: 4.82 Mil/uL (ref 3.87–5.11)
RDW: 15.1 % — ABNORMAL HIGH (ref 11.5–14.6)

## 2011-12-08 LAB — HEPATIC FUNCTION PANEL
Alkaline Phosphatase: 69 U/L (ref 39–117)
Bilirubin, Direct: 0 mg/dL (ref 0.0–0.3)
Total Protein: 7.7 g/dL (ref 6.0–8.3)

## 2011-12-08 LAB — BASIC METABOLIC PANEL
Calcium: 9.5 mg/dL (ref 8.4–10.5)
GFR: 74.66 mL/min (ref >60.00)
Sodium: 139 mEq/L (ref 135–145)

## 2011-12-12 ENCOUNTER — Encounter: Payer: Self-pay | Admitting: *Deleted

## 2011-12-13 ENCOUNTER — Encounter: Payer: Self-pay | Admitting: *Deleted

## 2012-04-26 ENCOUNTER — Other Ambulatory Visit: Payer: Self-pay | Admitting: *Deleted

## 2012-04-26 MED ORDER — ALBUTEROL SULFATE HFA 108 (90 BASE) MCG/ACT IN AERS: 2.0000 | INHALATION_SPRAY | Freq: Four times a day (QID) | RESPIRATORY_TRACT | Status: DC | PRN

## 2012-05-10 ENCOUNTER — Ambulatory Visit (INDEPENDENT_AMBULATORY_CARE_PROVIDER_SITE_OTHER): Payer: 59

## 2012-05-10 DIAGNOSIS — Z23 Encounter for immunization: Secondary | ICD-10-CM

## 2012-06-10 ENCOUNTER — Encounter: Payer: Self-pay | Admitting: Internal Medicine

## 2012-06-10 ENCOUNTER — Ambulatory Visit (INDEPENDENT_AMBULATORY_CARE_PROVIDER_SITE_OTHER): Payer: 59 | Admitting: Internal Medicine

## 2012-06-10 VITALS — BP 118/70 | HR 88 | Temp 98.0°F | Wt 212.0 lb

## 2012-06-10 DIAGNOSIS — J309 Allergic rhinitis, unspecified: Secondary | ICD-10-CM

## 2012-06-10 DIAGNOSIS — J45909 Unspecified asthma, uncomplicated: Secondary | ICD-10-CM

## 2012-06-10 DIAGNOSIS — I10 Essential (primary) hypertension: Secondary | ICD-10-CM

## 2012-06-10 NOTE — Assessment & Plan Note (Signed)
Mild and very intermittent Rarely uses SABA

## 2012-06-10 NOTE — Assessment & Plan Note (Signed)
Symptoms all year round Satisfied with current Rx

## 2012-06-10 NOTE — Progress Notes (Signed)
  Subjective:    Patient ID: Madison Stout, female    DOB: [birth year], 61 y.o.   MRN: [id number]  HPI Here with husband He had major accident and has been out of work  She has been busy with his care Hasn't been able to walk  No chest pain No SOB No sig edema  Some sinus drainage in past week Seems better now Has year round allergy problems Still on fluticasone and loratadine  Asthma seems to be quiet Hasn't used the albuterol in 2-3 months  Current Outpatient Prescriptions on File Prior to Visit  Medication Sig Dispense Refill  . albuterol (PROVENTIL HFA;VENTOLIN HFA) 108 (90 BASE) MCG/ACT inhaler Inhale 2 puffs into the lungs every 6 (six) hours as needed.  8.5 g  11  . aspirin 81 MG tablet Take 81 mg by mouth daily.        . cetirizine (ZYRTEC) 10 MG tablet Take 20 mg by mouth daily.        . fluticasone (FLONASE) 50 MCG/ACT nasal spray Place 2 sprays into the nose daily.  16 g  6  . loratadine (CLARITIN) 10 MG tablet Take 10 mg by mouth daily.        Marland Kitchen triamterene-hydrochlorothiazide (MAXZIDE-25) 37.5-25 MG per tablet Take 1 each (1 tablet total) by mouth daily.  90 tablet  3    Allergies  Allergen Reactions  . Clindamycin/Lincomycin     Chest felt tight, breathing off  . Ampicillin   . Penicillins     Past Medical History  Diagnosis Date  . Allergy   . Asthma   . Hypertension     Past Surgical History  Procedure Date  . Cesarean section   . Myomectomy 2005    removed  . Kidney stone surgery 12/04    Family History  Problem Relation Age of Onset  . Dementia Mother   . Hypertension Mother   . Diabetes Mother   . Cancer Mother     breast    History   Social History  . Marital Status: Married    Spouse Name: N/A    Number of Children: 1  . Years of Education: N/A   Occupational History  . [employer]    Social History Main Topics  . Smoking status: Never Smoker   . Smokeless tobacco: Never Used  . Alcohol Use: No  . Drug Use: No  .  Sexually Active: Not on file   Other Topics Concern  . Not on file   Social History Narrative  . No narrative on file   Review of Systems Weight is up 5# Sleeps okay    Objective:   Physical Exam  Constitutional: She appears well-developed and well-nourished. No distress.  HENT:  Mouth/Throat: Oropharynx is clear and moist. No oropharyngeal exudate.  Neck: Normal range of motion. Neck supple. No thyromegaly present.  Cardiovascular: Normal rate, regular rhythm and normal heart sounds.  Exam reveals no gallop.   No murmur heard. Pulmonary/Chest: Effort normal and breath sounds normal. No respiratory distress. She has no wheezes. She has no rales.  Musculoskeletal: She exhibits no edema and no tenderness.  Lymphadenopathy:    She has no cervical adenopathy.  Psychiatric: She has a normal mood and affect. Her behavior is normal. Thought content normal.          Assessment & Plan:

## 2012-06-10 NOTE — Assessment & Plan Note (Signed)
BP Readings from Last 3 Encounters:  06/10/12 118/70  12/07/11 122/70  09/11/11 120/80   Good control No changes needed

## 2012-07-05 ENCOUNTER — Other Ambulatory Visit: Payer: Self-pay | Admitting: Internal Medicine

## 2012-11-15 ENCOUNTER — Ambulatory Visit (INDEPENDENT_AMBULATORY_CARE_PROVIDER_SITE_OTHER): Payer: BC Managed Care – PPO | Admitting: Internal Medicine

## 2012-11-15 ENCOUNTER — Encounter: Payer: Self-pay | Admitting: Internal Medicine

## 2012-11-15 VITALS — BP 120/80 | HR 78 | Temp 98.1°F | Wt 213.0 lb

## 2012-11-15 DIAGNOSIS — M169 Osteoarthritis of hip, unspecified: Secondary | ICD-10-CM

## 2012-11-15 DIAGNOSIS — M159 Polyosteoarthritis, unspecified: Secondary | ICD-10-CM | POA: Insufficient documentation

## 2012-11-15 DIAGNOSIS — L259 Unspecified contact dermatitis, unspecified cause: Secondary | ICD-10-CM

## 2012-11-15 DIAGNOSIS — M1612 Unilateral primary osteoarthritis, left hip: Secondary | ICD-10-CM

## 2012-11-15 MED ORDER — CLOBETASOL PROPIONATE 0.05 % EX FOAM: Freq: Two times a day (BID) | CUTANEOUS | Status: DC | PRN

## 2012-11-15 NOTE — Progress Notes (Signed)
  Subjective:    Patient ID: Madison Stout, female    DOB: 1951-06-17, 62 y.o.   MRN: [id number]  HPI Has rash on right medial ankle Then left posterior neck, right wrist and left abdomen Wondered about poison oak--had been working out in yard Started about 3 weeks ago  Has been itching  Has tried some triamcinolone cream---some help  Having trouble with intermittent "nerve" pain in lateral left leg Goes back a couple of months No pain in back Notices it in the morning---some help with aleve Worse with lifting at work Seems to be okay when just walking Has noted some weakness and muscle tightening in that leg  Current Outpatient Prescriptions on File Prior to Visit  Medication Sig Dispense Refill  . albuterol (PROVENTIL HFA;VENTOLIN HFA) 108 (90 BASE) MCG/ACT inhaler Inhale 2 puffs into the lungs every 6 (six) hours as needed.  8.5 g  11  . aspirin 81 MG tablet Take 81 mg by mouth daily.        . cetirizine (ZYRTEC) 10 MG tablet Take 20 mg by mouth daily.        . fluticasone (FLONASE) 50 MCG/ACT nasal spray Place 2 sprays into the nose daily.  16 g  6  . loratadine (CLARITIN) 10 MG tablet Take 10 mg by mouth daily.        Marland Kitchen triamterene-hydrochlorothiazide (MAXZIDE-25) 37.5-25 MG per tablet TAKE ONE TABLET BY MOUTH EVERY DAY  90 tablet  2   No current facility-administered medications on file prior to visit.    Allergies  Allergen Reactions  . Clindamycin/Lincomycin     Chest felt tight, breathing off  . Ampicillin   . Penicillins     Past Medical History  Diagnosis Date  . Allergy   . Asthma   . Hypertension     Past Surgical History  Procedure Laterality Date  . Cesarean section    . Myomectomy  2005    removed  . Kidney stone surgery  12/04    Family History  Problem Relation Age of Onset  . Dementia Mother   . Hypertension Mother   . Diabetes Mother   . Cancer Mother     breast    History   Social History  . Marital Status: Married    Spouse  Name: N/A    Number of Children: 1  . Years of Education: N/A   Occupational History  . [employer]    Social History Main Topics  . Smoking status: Never Smoker   . Smokeless tobacco: Never Used  . Alcohol Use: No  . Drug Use: No  . Sexually Active: Not on file   Other Topics Concern  . Not on file   Social History Narrative  . No narrative on file   Review of Systems No problems with bowels No urine incontinence or trouble voiding    Objective:   Physical Exam  Constitutional: She appears well-developed and well-nourished. No distress.  Musculoskeletal:  No spine tenderness Mild lateral lumbar tenderness on left SLR negative Normal back flexion Pain is recreated with internal rotation of left hip  Neurological:  Gait normal No focal leg weakness  Skin:  Classic papulovesicular patch on right medial malleolus Scattered other small patches on other leg, right arm, abdomen          Assessment & Plan:

## 2012-11-15 NOTE — Assessment & Plan Note (Signed)
No signs of nerve root compression Symptoms could be from sciatica but pain is recreated with left hip internal rotation Gets relief with aleve---discussed up to 1 bid is fine Discussed regular exercise

## 2012-11-15 NOTE — Assessment & Plan Note (Signed)
Fairly classic appearance though lasting longer than expected  Did well in past with high potency steroid foam---will Rx

## 2012-12-19 ENCOUNTER — Ambulatory Visit (INDEPENDENT_AMBULATORY_CARE_PROVIDER_SITE_OTHER): Payer: BC Managed Care – PPO | Admitting: Internal Medicine

## 2012-12-19 ENCOUNTER — Encounter: Payer: Self-pay | Admitting: Internal Medicine

## 2012-12-19 VITALS — BP 120/70 | HR 87 | Temp 98.6°F | Ht 65.0 in | Wt 212.0 lb

## 2012-12-19 DIAGNOSIS — M1610 Unilateral primary osteoarthritis, unspecified hip: Secondary | ICD-10-CM

## 2012-12-19 DIAGNOSIS — M1612 Unilateral primary osteoarthritis, left hip: Secondary | ICD-10-CM

## 2012-12-19 DIAGNOSIS — Z0000 Encounter for general adult medical examination without abnormal findings: Secondary | ICD-10-CM

## 2012-12-19 DIAGNOSIS — E669 Obesity, unspecified: Secondary | ICD-10-CM

## 2012-12-19 DIAGNOSIS — Z1211 Encounter for screening for malignant neoplasm of colon: Secondary | ICD-10-CM

## 2012-12-19 DIAGNOSIS — I10 Essential (primary) hypertension: Secondary | ICD-10-CM

## 2012-12-19 DIAGNOSIS — J45909 Unspecified asthma, uncomplicated: Secondary | ICD-10-CM

## 2012-12-19 DIAGNOSIS — M169 Osteoarthritis of hip, unspecified: Secondary | ICD-10-CM

## 2012-12-19 MED ORDER — FLUTICASONE PROPIONATE 50 MCG/ACT NA SUSP: 2.0000 | Freq: Every day | NASAL | Status: DC

## 2012-12-19 NOTE — Progress Notes (Signed)
Subjective:    Patient ID: Madison Stout, female    DOB: 05-Sep-1950, 62 y.o.   MRN: [id number]  HPI Here for physical Has sore throat and head congestion this week No fever Slight cough No SOB  No other new concerns Same job  Current Outpatient Prescriptions on File Prior to Visit  Medication Sig Dispense Refill  . albuterol (PROVENTIL HFA;VENTOLIN HFA) 108 (90 BASE) MCG/ACT inhaler Inhale 2 puffs into the lungs every 6 (six) hours as needed.  8.5 g  11  . aspirin 81 MG tablet Take 81 mg by mouth daily.        Marland Kitchen azelastine (ASTELIN) 137 MCG/SPRAY nasal spray Place 1 spray into the nose 2 (two) times daily. Use in each nostril as directed      . cetirizine (ZYRTEC) 10 MG tablet Take 20 mg by mouth daily.        . clobetasol (OLUX) 0.05 % topical foam Apply topically 2 (two) times daily as needed.  50 g  0  . loratadine (CLARITIN) 10 MG tablet Take 10 mg by mouth daily.        Marland Kitchen triamterene-hydrochlorothiazide (MAXZIDE-25) 37.5-25 MG per tablet TAKE ONE TABLET BY MOUTH EVERY DAY  90 tablet  2   No current facility-administered medications on file prior to visit.    Allergies  Allergen Reactions  . Clindamycin/Lincomycin     Chest felt tight, breathing off  . Ampicillin   . Penicillins     Past Medical History  Diagnosis Date  . Allergy   . Asthma   . Hypertension     Past Surgical History  Procedure Laterality Date  . Cesarean section    . Myomectomy  2005    removed  . Kidney stone surgery  12/04    Family History  Problem Relation Age of Onset  . Dementia Mother   . Hypertension Mother   . Diabetes Mother   . Cancer Mother     breast    History   Social History  . Marital Status: Married    Spouse Name: N/A    Number of Children: 1  . Years of Education: N/A   Occupational History  . [employer]    Social History Main Topics  . Smoking status: Never Smoker   . Smokeless tobacco: Never Used  . Alcohol Use: No  . Drug Use: No  . Sexually Active:  Not on file   Other Topics Concern  . Not on file   Social History Narrative  . No narrative on file   Review of Systems  Constitutional: Negative for fatigue and unexpected weight change.       Weight up 4# since last year Wears seat belt  HENT: Positive for hearing loss, congestion, rhinorrhea and tinnitus. Negative for dental problem.        Regular with dentist  Eyes: Negative for visual disturbance.       Gets 'cramp" in right eye and itches Due for eye exam  Respiratory: Positive for cough. Negative for chest tightness, shortness of breath and wheezing.        Asthma quiet Hasn't needed albuterol  Cardiovascular: Positive for chest pain. Negative for palpitations and leg swelling.       Slight pain in bed last night--near shoulder. Went away fairly quickly  Gastrointestinal: Negative for nausea, vomiting, abdominal pain, constipation and blood in stool.       No heartburn  Endocrine: Negative for cold intolerance and heat intolerance.  Genitourinary: Positive for frequency. Negative for dysuria, urgency, difficulty urinating and dyspareunia.  Musculoskeletal: Positive for back pain and arthralgias. Negative for joint swelling.       Hip and knee pain Aleve does help pain  Skin: Negative for rash.       Rash cleared No suspicious lesions  Allergic/Immunologic: Positive for environmental allergies. Negative for immunocompromised state.  Neurological: Positive for weakness and headaches. Negative for dizziness, syncope and light-headedness.       Occ mild headaches---usually doesn't need meds Left leg gets numb, achy and weak at times---sciatica  Psychiatric/Behavioral: Negative for sleep disturbance and dysphoric mood. The patient is not nervous/anxious.        Objective:   Physical Exam  Constitutional: She appears well-developed and well-nourished. No distress.  HENT:  Head: Normocephalic and atraumatic.  Right Ear: External ear normal.  Left Ear: External ear  normal.  Mouth/Throat: Oropharynx is clear and moist. No oropharyngeal exudate.  Eyes: Conjunctivae and EOM are normal. Pupils are equal, round, and reactive to light.  Neck: Normal range of motion. Neck supple. No thyromegaly present.  Cardiovascular: Normal rate, regular rhythm, normal heart sounds and intact distal pulses.  Exam reveals no gallop.   No murmur heard. Pulmonary/Chest: Effort normal and breath sounds normal. No respiratory distress. She has no wheezes. She has no rales.  Abdominal: Soft. There is no tenderness.  Genitourinary:  Moderate cystic changes in both breasts without any worrisome lesions  Musculoskeletal: She exhibits no edema and no tenderness.  Lymphadenopathy:    She has no cervical adenopathy.    She has no axillary adenopathy.  Skin: No rash noted. No erythema.  Psychiatric: She has a normal mood and affect. Her behavior is normal.          Assessment & Plan:

## 2012-12-19 NOTE — Patient Instructions (Addendum)
Please schedule your screening mammogram.  DASH Diet The DASH diet stands for "Dietary Approaches to Stop Hypertension." It is a healthy eating plan that has been shown to reduce high blood pressure (hypertension) in as little as 14 days, while also possibly providing other significant health benefits. These other health benefits include reducing the risk of breast cancer after menopause and reducing the risk of type 2 diabetes, heart disease, colon cancer, and stroke. Health benefits also include weight loss and slowing kidney failure in patients with chronic kidney disease.  DIET GUIDELINES  Limit salt (sodium). Your diet should contain less than 1500 mg of sodium daily.  Limit refined or processed carbohydrates. Your diet should include mostly whole grains. Desserts and added sugars should be used sparingly.  Include small amounts of heart-healthy fats. These types of fats include nuts, oils, and tub margarine. Limit saturated and trans fats. These fats have been shown to be harmful in the body. CHOOSING FOODS  The following food groups are based on a 2000 calorie diet. See your Registered Dietitian for individual calorie needs. Grains and Grain Products (6 to 8 servings daily)  Eat More Often: Whole-wheat bread, brown rice, whole-grain or wheat pasta, quinoa, popcorn without added fat or salt (air popped).  Eat Less Often: White bread, white pasta, white rice, cornbread. Vegetables (4 to 5 servings daily)  Eat More Often: Fresh, frozen, and canned vegetables. Vegetables may be raw, steamed, roasted, or grilled with a minimal amount of fat.  Eat Less Often/Avoid: Creamed or fried vegetables. Vegetables in a cheese sauce. Fruit (4 to 5 servings daily)  Eat More Often: All fresh, canned (in natural juice), or frozen fruits. Dried fruits without added sugar. One hundred percent fruit juice ( cup [237 mL] daily).  Eat Less Often: Dried fruits with added sugar. Canned fruit in light or  heavy syrup. [employer], Fish, and Poultry (2 servings or less daily. One serving is 3 to 4 oz [85-114 g]).  Eat More Often: Ninety percent or leaner ground beef, tenderloin, sirloin. Round cuts of beef, chicken breast, [place] breast. All fish. Grill, bake, or broil your meat. Nothing should be fried.  Eat Less Often/Avoid: Fatty cuts of meat, [place], or chicken leg, thigh, or wing. Fried cuts of meat or fish. Dairy (2 to 3 servings)  Eat More Often: Low-fat or fat-free milk, low-fat plain or light yogurt, reduced-fat or part-skim cheese.  Eat Less Often/Avoid: Milk (whole, 2%).Whole milk yogurt. Full-fat cheeses. Nuts, Seeds, and Legumes (4 to 5 servings per week)  Eat More Often: All without added salt.  Eat Less Often/Avoid: Salted nuts and seeds, canned beans with added salt. Fats and Sweets (limited)  Eat More Often: Vegetable oils, tub margarines without trans fats, sugar-free gelatin. Mayonnaise and salad dressings.  Eat Less Often/Avoid: Coconut oils, palm oils, butter, stick margarine, cream, half and half, cookies, candy, pie. FOR MORE INFORMATION The Dash Diet Eating Plan: www.dashdiet.org Document Released: 06/15/2011 Document Revised: 09/18/2011 Document Reviewed: 06/15/2011 [facility] Patient Information 2014 [place], [place].

## 2012-12-19 NOTE — Assessment & Plan Note (Signed)
Healthy but still obese Discussed fitness She will set up mammo Fecal immunoassay

## 2012-12-19 NOTE — Assessment & Plan Note (Signed)
BP Readings from Last 3 Encounters:  12/19/12 120/70  11/15/12 120/80  06/10/12 118/70   Good control Due for labs

## 2012-12-19 NOTE — Assessment & Plan Note (Signed)
Aleve helps

## 2012-12-19 NOTE — Assessment & Plan Note (Signed)
Quiet Hasn't needed even rescue inhaler

## 2012-12-19 NOTE — Assessment & Plan Note (Signed)
DASH diet info given 

## 2012-12-20 LAB — CBC WITH DIFFERENTIAL/PLATELET
Basophils Absolute: 0 10*3/uL (ref 0.0–0.1)
Eosinophils Absolute: 0.1 10*3/uL (ref 0.0–0.7)
HCT: 40.7 % (ref 36.0–46.0)
Hemoglobin: 13.5 g/dL (ref 12.0–15.0)
Lymphs Abs: 1.2 10*3/uL (ref 0.7–4.0)
MCHC: 33.2 g/dL (ref 30.0–36.0)
Monocytes Relative: 8.6 % (ref 3.0–12.0)
Neutro Abs: 2.4 10*3/uL (ref 1.4–7.7)
RDW: 14.8 % — ABNORMAL HIGH (ref 11.5–14.6)

## 2012-12-20 LAB — HEPATIC FUNCTION PANEL
Albumin: 4 g/dL (ref 3.5–5.2)
Alkaline Phosphatase: 74 U/L (ref 39–117)
Total Protein: 8.1 g/dL (ref 6.0–8.3)

## 2012-12-20 LAB — BASIC METABOLIC PANEL
CO2: 27 mEq/L (ref 19–32)
Chloride: 104 mEq/L (ref 96–112)
Glucose, Bld: 81 mg/dL (ref 70–99)
Potassium: 4 mEq/L (ref 3.5–5.1)
Sodium: 139 mEq/L (ref 135–145)

## 2013-01-02 ENCOUNTER — Other Ambulatory Visit (INDEPENDENT_AMBULATORY_CARE_PROVIDER_SITE_OTHER): Payer: BC Managed Care – PPO

## 2013-01-02 DIAGNOSIS — Z1211 Encounter for screening for malignant neoplasm of colon: Secondary | ICD-10-CM

## 2013-01-08 ENCOUNTER — Ambulatory Visit: Payer: Self-pay | Admitting: Internal Medicine

## 2013-01-08 ENCOUNTER — Encounter: Payer: Self-pay | Admitting: Internal Medicine

## 2013-02-19 ENCOUNTER — Other Ambulatory Visit: Payer: Self-pay | Admitting: *Deleted

## 2013-02-19 MED ORDER — AZELASTINE HCL 0.1 % NA SOLN: 1.0000 | Freq: Two times a day (BID) | NASAL | Status: DC

## 2013-04-03 ENCOUNTER — Ambulatory Visit (INDEPENDENT_AMBULATORY_CARE_PROVIDER_SITE_OTHER): Payer: BC Managed Care – PPO

## 2013-04-03 DIAGNOSIS — Z23 Encounter for immunization: Secondary | ICD-10-CM

## 2013-04-05 ENCOUNTER — Other Ambulatory Visit: Payer: Self-pay | Admitting: Internal Medicine

## 2013-05-20 ENCOUNTER — Ambulatory Visit (INDEPENDENT_AMBULATORY_CARE_PROVIDER_SITE_OTHER): Payer: BC Managed Care – PPO | Admitting: Internal Medicine

## 2013-05-20 ENCOUNTER — Encounter: Payer: Self-pay | Admitting: Internal Medicine

## 2013-05-20 VITALS — BP 120/68 | HR 81 | Temp 98.6°F | Wt 196.0 lb

## 2013-05-20 DIAGNOSIS — M5431 Sciatica, right side: Secondary | ICD-10-CM

## 2013-05-20 DIAGNOSIS — M5430 Sciatica, unspecified side: Secondary | ICD-10-CM

## 2013-05-20 MED ORDER — PREDNISONE 10 MG PO TABS: ORAL_TABLET | ORAL | Status: DC

## 2013-05-20 MED ORDER — TRAMADOL HCL 50 MG PO TABS: 50.0000 mg | ORAL_TABLET | Freq: Three times a day (TID) | ORAL | Status: DC | PRN

## 2013-05-20 NOTE — Progress Notes (Signed)
Subjective:    Patient ID: Madison Stout, female    DOB: 05-Dec-1950, 62 y.o.   MRN: [id number]  HPI  Pt presents to the clinic today with c/o right leg pain. This started 3 days ago. The pain started in her hip, then radiated to her back and down her right leg. 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 She denies any specific injury that she is aware. She describes the pain as sharp and shooting, with some numbness afterword. She has been taking Aleve and tylenol arthritis which did not help. She denies loss of bowel or bladder.  Review of Systems      Past Medical History  Diagnosis Date  . Allergy   . Asthma   . Hypertension     Current Outpatient Prescriptions  Medication Sig Dispense Refill  . albuterol (PROVENTIL HFA;VENTOLIN HFA) 108 (90 BASE) MCG/ACT inhaler Inhale 2 puffs into the lungs every 6 (six) hours as needed.  8.5 g  11  . aspirin 81 MG tablet Take 81 mg by mouth daily.        Marland Kitchen azelastine (ASTELIN) 137 MCG/SPRAY nasal spray Place 1 spray into the nose 2 (two) times daily.  30 mL  1  . Bromelain 250 MG CAPS Take 1 capsule by mouth daily.      . cetirizine (ZYRTEC) 10 MG tablet Take 20 mg by mouth daily.        . clobetasol (OLUX) 0.05 % topical foam Apply topically 2 (two) times daily as needed.  50 g  0  . fluticasone (FLONASE) 50 MCG/ACT nasal spray Place 2 sprays into the nose daily.  16 g  6  . loratadine (CLARITIN) 10 MG tablet Take 10 mg by mouth daily.        Marland Kitchen triamterene-hydrochlorothiazide (MAXZIDE-25) 37.5-25 MG per tablet TAKE ONE TABLET BY MOUTH EVERY DAY  90 tablet  0   No current facility-administered medications for this visit.    Allergies  Allergen Reactions  . Clindamycin/Lincomycin     Chest felt tight, breathing off  . Ampicillin   . Penicillins     Family History  Problem Relation Age of Onset  . Dementia Mother   . Hypertension Mother   . Diabetes Mother   . Cancer Mother     breast    History   Social History  .  Marital Status: Married    Spouse Name: N/A    Number of Children: 1  . Years of Education: N/A   Occupational History  . [employer]    Social History Main Topics  . Smoking status: Never Smoker   . Smokeless tobacco: Never Used  . Alcohol Use: No  . Drug Use: No  . Sexual Activity: Not on file   Other Topics Concern  . Not on file   Social History Narrative  . No narrative on file     Constitutional: Denies fever, malaise, fatigue, headache or abrupt weight changes.  Musculoskeletal: Denies decrease in range of motion, muscle pain or joint pain and swelling.  Skin: Denies redness, rashes, lesions or ulcercations.  Neurological: Denies dizziness, difficulty with memory, difficulty with speech or problems with balance and coordination.   No other specific complaints in a complete review of systems (except as listed in HPI above).  Objective:   Physical Exam   BP 120/68  Pulse 81  Temp(Src) 98.6 F (37 C) (Tympanic)  Wt 196 lb (88.905 kg)  SpO2 98% Wt Readings from Last  3 Encounters:  05/20/13 196 lb (88.905 kg)  12/19/12 212 lb (96.163 kg)  11/15/12 213 lb (96.616 kg)    General: Appears her stated age, well developed, well nourished in NAD.  Cardiovascular: Normal rate and rhythm. S1,S2 noted.  No murmur, rubs or gallops noted. No JVD or BLE edema. No carotid bruits noted. Pulmonary/Chest: Normal effort and positive vesicular breath sounds. No respiratory distress. No wheezes, rales or ronchi noted.  Musculoskeletal: Normal range of motion. No signs of joint swelling. No difficulty with gait.  Neurological: Alert and oriented. Cranial nerves II-XII intact. Coordination normal. +DTRs bilaterally. Positive straight leg raise.   BMET    Component Value Date/Time   NA 139 12/19/2012 1549   K 4.0 12/19/2012 1549   CL 104 12/19/2012 1549   CO2 27 12/19/2012 1549   GLUCOSE 81 12/19/2012 1549   BUN 22 12/19/2012 1549   CREATININE 1.1 12/19/2012 1549   CALCIUM 9.9  12/19/2012 1549   GFRNONAA 83.02 09/01/2009 1205   GFRAA 84 08/01/2006 1115    Lipid Panel     Component Value Date/Time   CHOL 176 11/23/2010 1019   TRIG 112.0 11/23/2010 1019   HDL 47.80 11/23/2010 1019   CHOLHDL 4 11/23/2010 1019   VLDL 22.4 11/23/2010 1019   LDLCALC 106* 11/23/2010 1019    CBC    Component Value Date/Time   WBC 4.0* 12/19/2012 1549   RBC 5.05 12/19/2012 1549   HGB 13.5 12/19/2012 1549   HCT 40.7 12/19/2012 1549   PLT 193.0 12/19/2012 1549   MCV 80.4 12/19/2012 1549   MCHC 33.2 12/19/2012 1549   RDW 14.8* 12/19/2012 1549   LYMPHSABS 1.2 12/19/2012 1549   MONOABS 0.3 12/19/2012 1549   EOSABS 0.1 12/19/2012 1549   BASOSABS 0.0 12/19/2012 1549    Hgb A1C No results found for this basename: HGBA1C        Assessment & Plan:   Sciatica Neuralgia, right:  Perform stretching exercises as indicated on handout eRx for pred taper x 9 days RX for tramadol- limited quantity, no refills Heating pad for comfort  RTC in 2 weeks or sooner if no improvement

## 2013-05-20 NOTE — Patient Instructions (Signed)

## 2013-06-03 ENCOUNTER — Other Ambulatory Visit: Payer: Self-pay | Admitting: Internal Medicine

## 2013-06-03 NOTE — Telephone Encounter (Signed)
Please advise if ok to refill, Thanks! 

## 2013-06-03 NOTE — Telephone Encounter (Signed)
Medication phoned in to [facility].

## 2013-06-03 NOTE — Telephone Encounter (Signed)
Ok to phone in.

## 2013-06-19 ENCOUNTER — Encounter: Payer: Self-pay | Admitting: Internal Medicine

## 2013-06-19 ENCOUNTER — Ambulatory Visit (INDEPENDENT_AMBULATORY_CARE_PROVIDER_SITE_OTHER): Payer: BC Managed Care – PPO | Admitting: Internal Medicine

## 2013-06-19 ENCOUNTER — Ambulatory Visit (INDEPENDENT_AMBULATORY_CARE_PROVIDER_SITE_OTHER)
Admission: RE | Admit: 2013-06-19 | Discharge: 2013-06-19 | Disposition: A | Discharge status: Home / Self Care | Payer: BC Managed Care – PPO | Source: Ambulatory Visit | Attending: Internal Medicine | Admitting: Internal Medicine

## 2013-06-19 VITALS — BP 120/80 | HR 88 | Temp 98.2°F | Wt 220.0 lb

## 2013-06-19 DIAGNOSIS — M5431 Sciatica, right side: Secondary | ICD-10-CM

## 2013-06-19 DIAGNOSIS — M5430 Sciatica, unspecified side: Secondary | ICD-10-CM

## 2013-06-19 DIAGNOSIS — M545 Low back pain, unspecified: Secondary | ICD-10-CM

## 2013-06-19 MED ORDER — PREDNISONE 20 MG PO TABS: 40.0000 mg | ORAL_TABLET | Freq: Every day | ORAL | Status: DC

## 2013-06-19 NOTE — Progress Notes (Signed)
Subjective:    Patient ID: Madison Stout, female    DOB: 1951/03/19, 62 y.o.   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 albuterol (PROVENTIL HFA;VENTOLIN HFA) 108 (90 BASE) MCG/ACT inhaler Inhale 2 puffs into the lungs every 6 (six) hours as needed.  8.5 g  11  . aspirin 81 MG tablet Take 81 mg by mouth daily.        Marland Kitchen azelastine (ASTELIN) 137 MCG/SPRAY nasal spray Place 1 spray into the nose 2 (two) times daily.  30 mL  1  . Bromelain 250 MG CAPS Take 1 capsule by mouth daily.      . cetirizine (ZYRTEC) 10 MG tablet Take 20 mg by mouth daily.        . clobetasol (OLUX) 0.05 % topical foam Apply topically 2 (two) times daily as needed.  50 g  0  . fluticasone (FLONASE) 50 MCG/ACT nasal spray Place 2 sprays into the nose daily.  16 g  6  . loratadine (CLARITIN) 10 MG tablet Take 10 mg by mouth daily.        . traMADol (ULTRAM) 50 MG tablet TAKE ONE TABLET BY MOUTH EVERY 8 HOURS AS NEEDED  30 tablet  0  . triamterene-hydrochlorothiazide (MAXZIDE-25) 37.5-25 MG per tablet TAKE ONE TABLET BY MOUTH EVERY DAY  90 tablet  0   No current facility-administered medications on file prior to visit.    Allergies  Allergen Reactions  . Clindamycin/Lincomycin     Chest felt tight, breathing off  . Ampicillin   .  Penicillins     Past Medical History  Diagnosis Date  . Allergy   . Asthma   . Hypertension     Past Surgical History  Procedure Laterality Date  . Cesarean section    . Myomectomy  2005    removed  . Kidney stone surgery  12/04    Family History  Problem Relation Age of Onset  . Dementia Mother   . Hypertension Mother   . Diabetes Mother   . Cancer Mother     breast    History   Social History  . Marital Status: Married    Spouse Name: N/A    Number of Children: 1  . Years of Education: N/A   Occupational History  . [employer]    Social History Main Topics  . Smoking status: Never Smoker   . Smokeless tobacco: Never Used  . Alcohol Use: No  . Drug Use: No  . Sexual Activity: Not on file   Other Topics Concern  . Not on file   Social History Narrative  . No narrative on file    Review of Systems No change in bowel habits Increased urination and nocturia--no incontinence No dysuria No  fever    Objective:   Physical Exam  Musculoskeletal:  Localized spine tenderness around L5-S1 SLR caused thigh pain in each leg--- quickly on right and ~80 degrees on left  Neurological:  Very slow small steps but full weight bearing No focal weakness in legs Reflexes 1+ and symmetric Babinski absent          Assessment & Plan:

## 2013-06-19 NOTE — Patient Instructions (Signed)
Please resume the aleve-- 2 tabs twice a day with food. If you are not improving within the next 2 weeks, let me know.

## 2013-06-19 NOTE — Assessment & Plan Note (Signed)
Still not clearly a HNP but certainly possible The localized spine tenderness makes a plain x-ray appropriate---will go ahead with that Try the prednisone again Aleve 2 bid  Some hand stiffness in AM but exam shows no synovitis so doubt a systemic arthritic condition  If persists, will consider PT (she would like to avoid surgery)

## 2013-07-06 ENCOUNTER — Other Ambulatory Visit: Payer: Self-pay | Admitting: Internal Medicine

## 2013-12-22 ENCOUNTER — Encounter: Payer: BC Managed Care – PPO | Admitting: Internal Medicine

## 2013-12-29 ENCOUNTER — Encounter: Payer: Self-pay | Admitting: Internal Medicine

## 2013-12-29 ENCOUNTER — Ambulatory Visit (INDEPENDENT_AMBULATORY_CARE_PROVIDER_SITE_OTHER): Payer: BC Managed Care – PPO | Admitting: Internal Medicine

## 2013-12-29 VITALS — BP 120/70 | HR 87 | Temp 98.4°F | Ht 65.0 in | Wt 218.0 lb

## 2013-12-29 DIAGNOSIS — Z0000 Encounter for general adult medical examination without abnormal findings: Secondary | ICD-10-CM

## 2013-12-29 DIAGNOSIS — M79609 Pain in unspecified limb: Secondary | ICD-10-CM

## 2013-12-29 DIAGNOSIS — I10 Essential (primary) hypertension: Secondary | ICD-10-CM

## 2013-12-29 DIAGNOSIS — M79604 Pain in right leg: Secondary | ICD-10-CM

## 2013-12-29 DIAGNOSIS — J45909 Unspecified asthma, uncomplicated: Secondary | ICD-10-CM

## 2013-12-29 DIAGNOSIS — M79605 Pain in left leg: Secondary | ICD-10-CM

## 2013-12-29 DIAGNOSIS — Z1211 Encounter for screening for malignant neoplasm of colon: Secondary | ICD-10-CM

## 2013-12-29 MED ORDER — AMOXICILLIN 500 MG PO TABS: 1000.0000 mg | ORAL_TABLET | Freq: Two times a day (BID) | ORAL | Status: DC

## 2013-12-29 MED ORDER — PREDNISONE 20 MG PO TABS: 40.0000 mg | ORAL_TABLET | Freq: Every day | ORAL | Status: DC

## 2013-12-29 NOTE — Assessment & Plan Note (Signed)
Has been quiet 

## 2013-12-29 NOTE — Assessment & Plan Note (Signed)
With weakness with exertion Sounds like pseudoclaudication Discussed options---will hold off on MRI for now due to expense Trial of prednisone again PT eval MRI lumbar spine if worsens

## 2013-12-29 NOTE — Assessment & Plan Note (Signed)
Will do fecal immunoassay Mammogram due next year----breast exam benign today with some bilateral cysts Hard to do exercise with current symptoms

## 2013-12-29 NOTE — Assessment & Plan Note (Signed)
BP Readings from Last 3 Encounters:  12/29/13 120/70  06/19/13 120/80  05/20/13 120/68   Good control Due for labs

## 2013-12-29 NOTE — Progress Notes (Signed)
Pre visit review using our clinic review tool, if applicable. No additional management support is needed unless otherwise documented below in the visit note. 

## 2013-12-29 NOTE — Progress Notes (Signed)
Subjective:    Patient ID: Madison Stout, female    DOB: [birth year], 63 y.o.   MRN: [id number]  HPI Here for physical--with husband  Having problems with both legs---"they don't want to carry me" Pain on both sides from hip down legs Needs 2 aleve every day and prn med This goes back to when she was here last This happens with walking--can't walk much anymore No UE problems except some numbness at times Legs "go to sleep also"  Some congestion in ear Tried nasal decongestant and had lip tingling the first dose--then tolerated 2nd dose Ear seems better now  Current Outpatient Prescriptions on File Prior to Visit  Medication Sig Dispense Refill  . albuterol (PROVENTIL HFA;VENTOLIN HFA) 108 (90 BASE) MCG/ACT inhaler Inhale 2 puffs into the lungs every 6 (six) hours as needed.  8.5 g  11  . aspirin 81 MG tablet Take 81 mg by mouth daily.        Marland Kitchen azelastine (ASTELIN) 137 MCG/SPRAY nasal spray Place 1 spray into the nose 2 (two) times daily.  30 mL  1  . Bromelain 250 MG CAPS Take 1 capsule by mouth daily.      . cetirizine (ZYRTEC) 10 MG tablet Take 20 mg by mouth daily.        . clobetasol (OLUX) 0.05 % topical foam Apply topically 2 (two) times daily as needed.  50 g  0  . fluticasone (FLONASE) 50 MCG/ACT nasal spray Place 2 sprays into the nose daily.  16 g  6  . loratadine (CLARITIN) 10 MG tablet Take 10 mg by mouth daily.        . traMADol (ULTRAM) 50 MG tablet TAKE ONE TABLET BY MOUTH EVERY 8 HOURS AS NEEDED  30 tablet  0  . triamterene-hydrochlorothiazide (MAXZIDE-25) 37.5-25 MG per tablet TAKE ONE TABLET BY MOUTH ONCE DAILY  90 tablet  3   No current facility-administered medications on file prior to visit.    Allergies  Allergen Reactions  . Clindamycin/Lincomycin     Chest felt tight, breathing off  . Ampicillin   . Penicillins     Past Medical History  Diagnosis Date  . Allergy   . Asthma   . Hypertension     Past Surgical History  Procedure Laterality  Date  . Cesarean section    . Myomectomy  2005    removed  . Kidney stone surgery  12/04    Family History  Problem Relation Age of Onset  . Dementia Mother   . Hypertension Mother   . Diabetes Mother   . Cancer Mother     breast    History   Social History  . Marital Status: Married    Spouse Name: N/A    Number of Children: 1  . Years of Education: N/A   Occupational History  . Supervisor [employer]   Social History Main Topics  . Smoking status: Never Smoker   . Smokeless tobacco: Never Used  . Alcohol Use: No  . Drug Use: No  . Sexual Activity: Not on file   Other Topics Concern  . Not on file   Social History Narrative  . No narrative on file   Review of Systems  Constitutional: Negative for fatigue and unexpected weight change.       Wears seat belt  HENT: Positive for congestion and hearing loss. Negative for dental problem and tinnitus.        Regular with dentist  Eyes: Negative  for visual disturbance.       No diplopia Some trouble blurriness with fine print  Respiratory: Negative for cough, chest tightness and shortness of breath.   Cardiovascular: Positive for palpitations. Negative for chest pain and leg swelling.       Notes fast heart when active  Gastrointestinal: Positive for constipation. Negative for nausea, vomiting, abdominal pain and blood in stool.       No heartburn Using stool softener and prune juice for bowels  Endocrine: Positive for cold intolerance and heat intolerance.  Genitourinary: Positive for urgency. Negative for dysuria, difficulty urinating and dyspareunia.       Urgency but no incontinence  Musculoskeletal: Negative for back pain and joint swelling.       Hip and leg pain  Skin: Positive for rash.       Still gets some dark rash at times-- mostly in summer  Allergic/Immunologic: Positive for environmental allergies. Negative for immunocompromised state.  Neurological: Positive for weakness, numbness and headaches.  Negative for dizziness, syncope and light-headedness.  Hematological: Negative for adenopathy. Does not bruise/bleed easily.  Psychiatric/Behavioral: Negative for sleep disturbance and dysphoric mood. The patient is not nervous/anxious.        Objective:   Physical Exam  Constitutional: She appears well-developed and well-nourished. No distress.  HENT:  Head: Normocephalic and atraumatic.  Right Ear: External ear normal.  Left Ear: External ear normal.  Mouth/Throat: Oropharynx is clear and moist.  Eyes: Conjunctivae and EOM are normal. Pupils are equal, round, and reactive to light.  Neck: Normal range of motion. Neck supple. No thyromegaly present.  Cardiovascular: Normal rate, regular rhythm, normal heart sounds and intact distal pulses.  Exam reveals no gallop.   No murmur heard. Pulmonary/Chest: Effort normal and breath sounds normal. No respiratory distress. She has no wheezes. She has no rales.  Abdominal: Soft. There is no tenderness.  Musculoskeletal:  Normal ROM in hips No spine tenderness   Lymphadenopathy:    She has no cervical adenopathy.  Neurological:  Antalgic gait No focal leg weakness though  Skin: Skin is warm. No erythema.  Psychiatric: She has a normal mood and affect. Her behavior is normal.          Assessment & Plan:

## 2013-12-30 LAB — CBC WITH DIFFERENTIAL/PLATELET
BASOS PCT: 0.9 % (ref 0.0–3.0)
Basophils Absolute: 0.1 10*3/uL (ref 0.0–0.1)
EOS ABS: 0.2 10*3/uL (ref 0.0–0.7)
Eosinophils Relative: 3 % (ref 0.0–5.0)
HEMATOCRIT: 39.5 % (ref 36.0–46.0)
HEMOGLOBIN: 12.9 g/dL (ref 12.0–15.0)
Lymphocytes Relative: 34.4 % (ref 12.0–46.0)
Lymphs Abs: 2 10*3/uL (ref 0.7–4.0)
MCHC: 32.5 g/dL (ref 30.0–36.0)
MCV: 79.7 fl (ref 78.0–100.0)
Monocytes Absolute: 0.3 10*3/uL (ref 0.1–1.0)
Monocytes Relative: 5.2 % (ref 3.0–12.0)
NEUTROS ABS: 3.2 10*3/uL (ref 1.4–7.7)
Neutrophils Relative %: 56.5 % (ref 43.0–77.0)
Platelets: 195 10*3/uL (ref 150.0–400.0)
RBC: 4.95 Mil/uL (ref 3.87–5.11)
RDW: 15 % (ref 11.5–15.5)
WBC: 5.7 10*3/uL (ref 4.0–10.5)

## 2013-12-30 LAB — COMPREHENSIVE METABOLIC PANEL
ALK PHOS: 65 U/L (ref 39–117)
ALT: 19 U/L (ref 0–35)
AST: 24 U/L (ref 0–37)
Albumin: 4.2 g/dL (ref 3.5–5.2)
BILIRUBIN TOTAL: 0.5 mg/dL (ref 0.2–1.2)
BUN: 21 mg/dL (ref 6–23)
CO2: 29 mEq/L (ref 19–32)
CREATININE: 1.2 mg/dL (ref 0.4–1.2)
Calcium: 9.8 mg/dL (ref 8.4–10.5)
Chloride: 102 mEq/L (ref 96–112)
GFR: 60.43 mL/min (ref >60.00)
Glucose, Bld: 91 mg/dL (ref 70–99)
Potassium: 3.5 mEq/L (ref 3.5–5.1)
Sodium: 139 mEq/L (ref 135–145)
Total Protein: 8.1 g/dL (ref 6.0–8.3)

## 2013-12-30 LAB — TSH: TSH: 0.95 u[IU]/mL (ref 0.35–4.50)

## 2013-12-30 LAB — T4, FREE: Free T4: 0.77 ng/dL (ref 0.60–1.60)

## 2014-01-23 IMAGING — MG MM CAD SCREENING MAMMO
1 series · 4 of 4 positions shown · non-contrast
Comparison: none

REASON FOR EXAM: SCR MAMMO NO ORDER
COMMENTS:

[R CC · right · 4 of 4 slices shown]
[im 1/4]
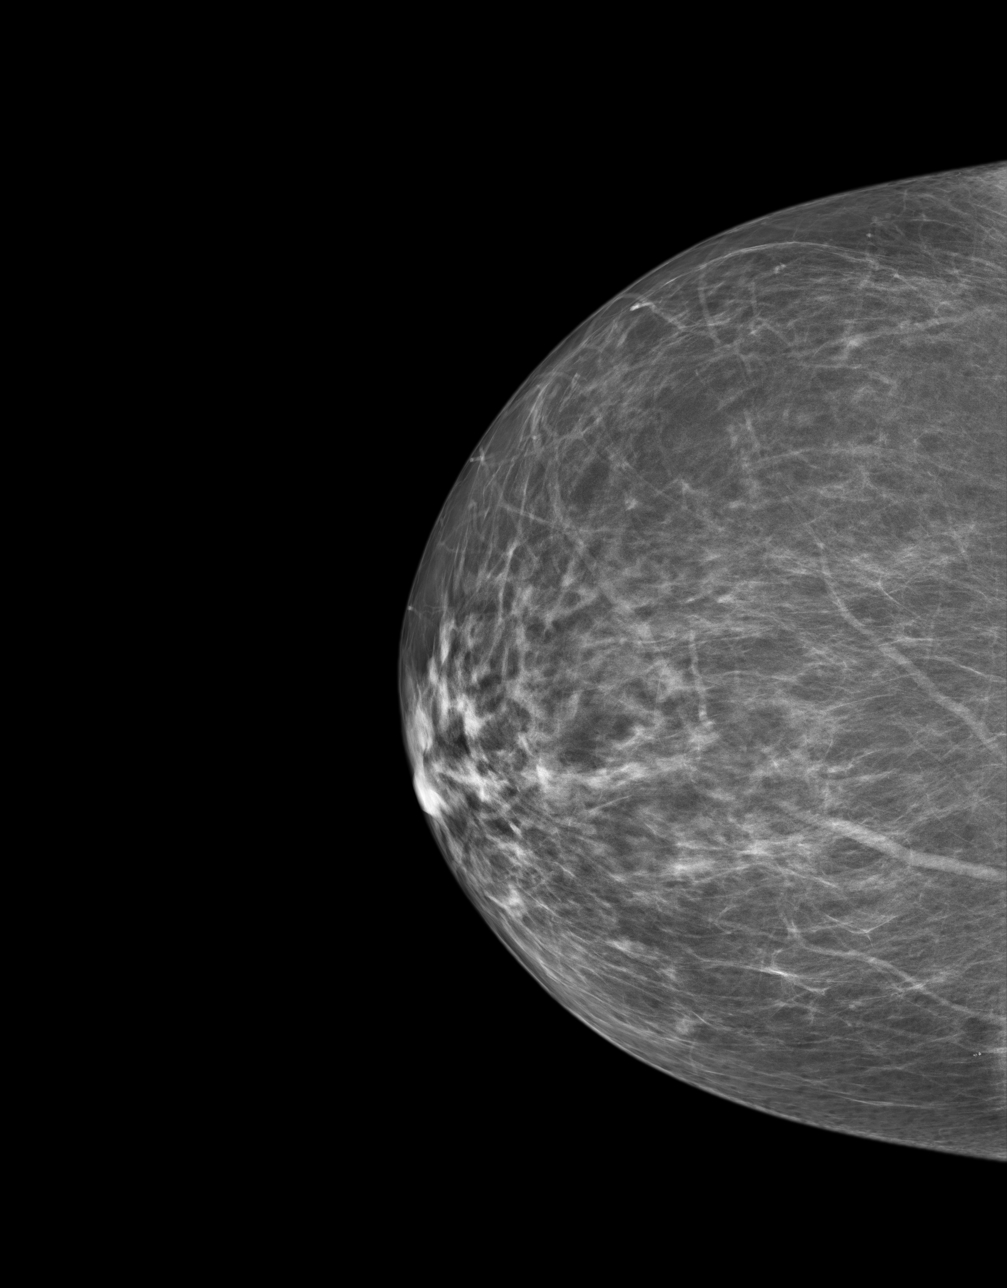
[im 2/4]
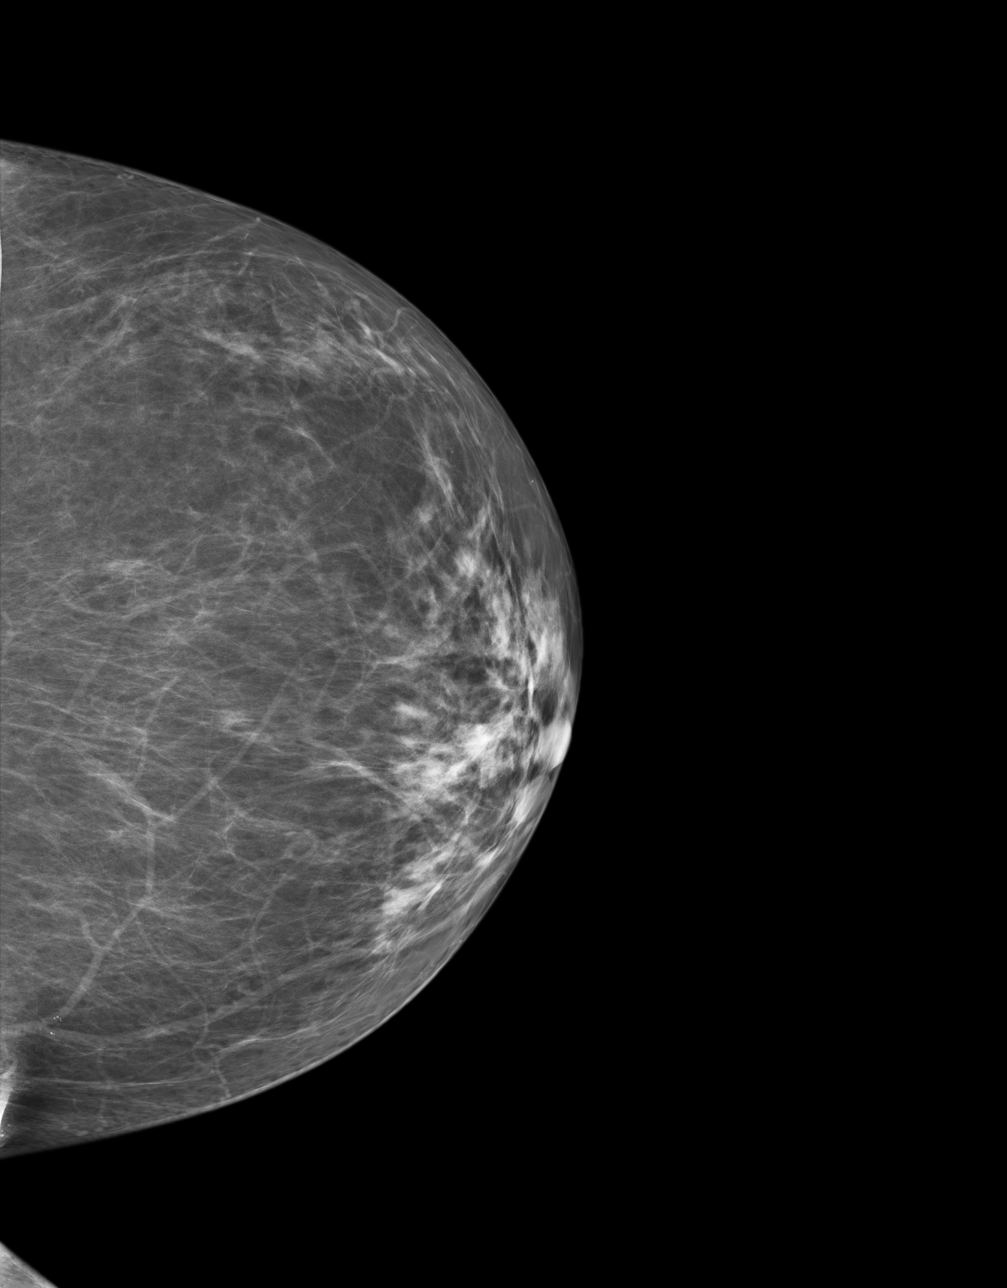
[im 3/4]
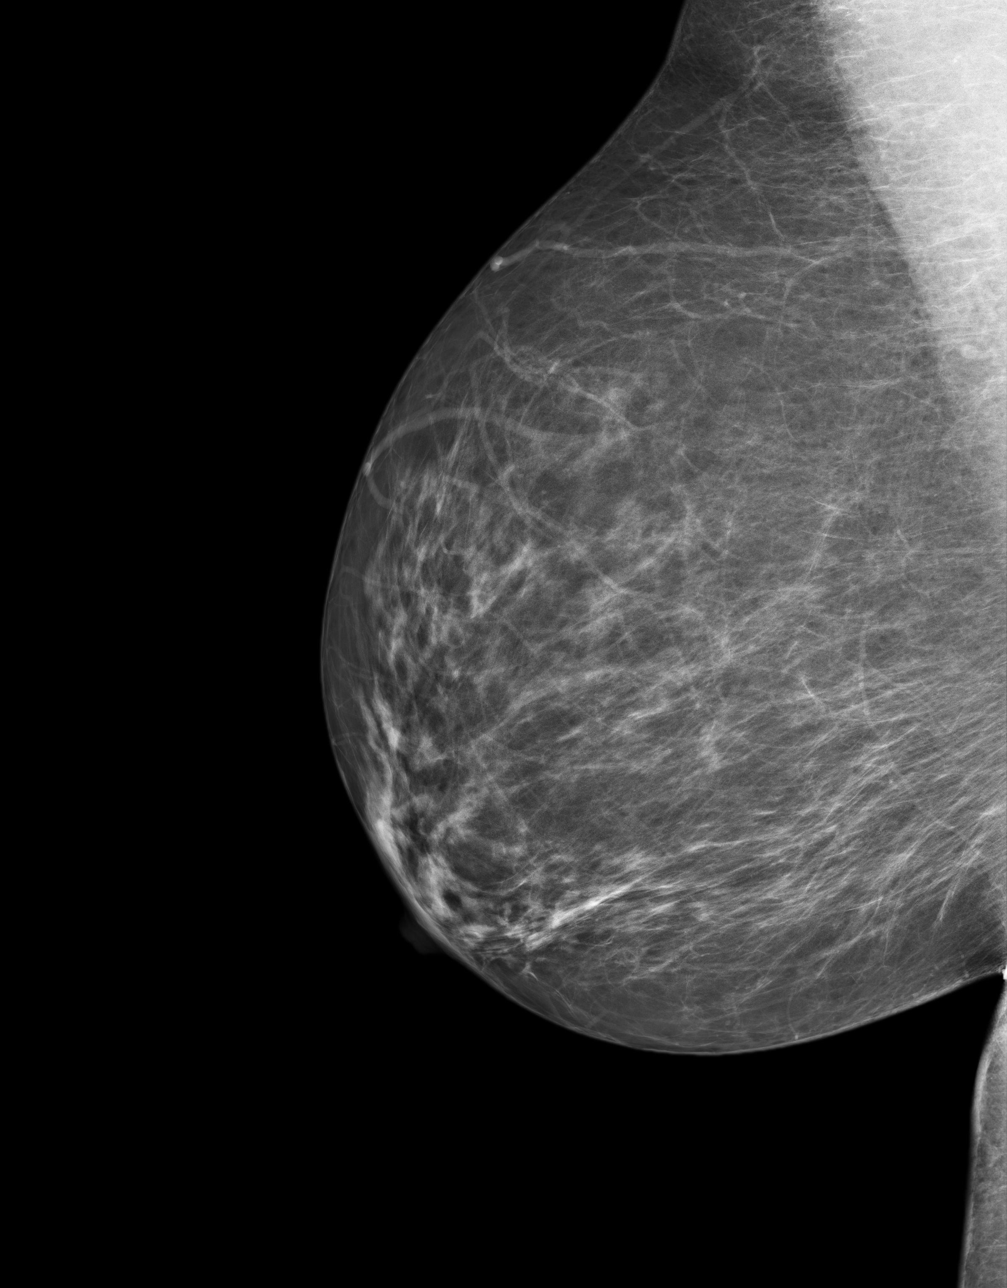
[im 4/4]
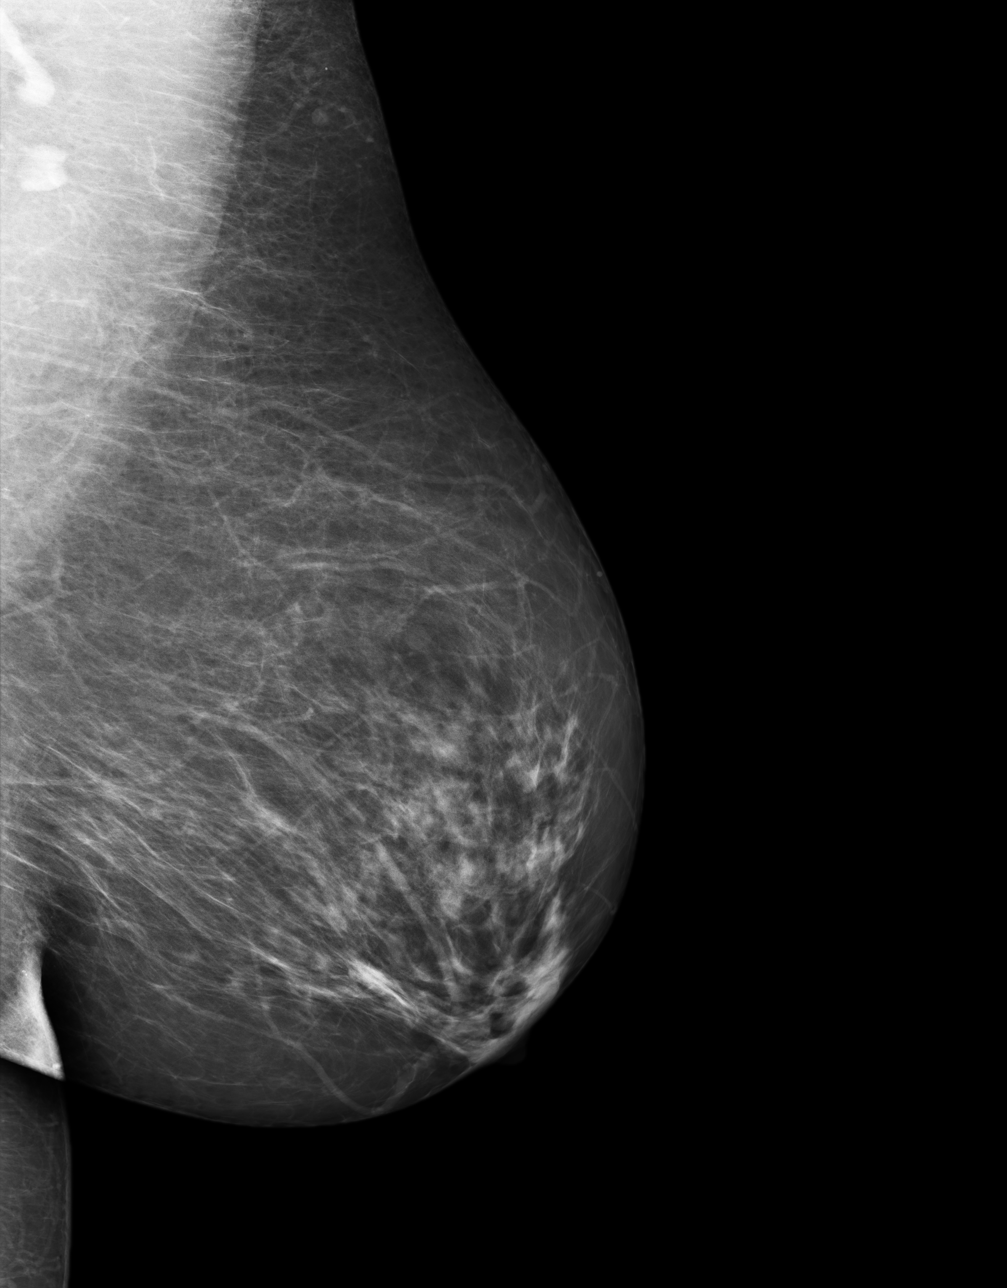

[4 of 4 positions shown; findings below may reference images not displayed]

PROCEDURE:     MAM - MAM DGTL SCRN MAM NO ORDER W/CAD  - January 08, 2013  [DATE]

RESULT:     Comparison is made to previous digital studies December 28, 2010,November 11, 2008, and October 16, 2007.

The breasts exhibit a scattered fibroglandular pattern with evidence of
ongoing involution. There are benign-appearing lymph nodes in the left
axillary region. There is no dominant mass and there are no malignant
appearing groupings of microcalcification.
IMPRESSION: There are no findings suspicious for malignancy.

BI-RADS 2: Benign findings.

Recommendation: Please continue to encourage yearly mammographic followup.

BREAST COMPOSITION: The breast composition is SCATTERED FIBROGLANDULAR
TISSUE (glandular tissue is 25-50%)

A NEGATIVE MAMMOGRAM REPORT DOES NOT PRECLUDE BIOPSY OR OTHER EVALUATION OF
A CLINICALLY PALPABLE OR OTHERWISE SUSPICIOUS MASS OR LESION. BREAST CANCER
MAY NOT BE DETECTED BY MAMMOGRAPHY IN UP TO 10% OF CASES.

[REDACTED]

## 2014-02-12 ENCOUNTER — Ambulatory Visit: Payer: BC Managed Care – PPO | Admitting: Internal Medicine

## 2014-04-30 ENCOUNTER — Other Ambulatory Visit: Payer: Self-pay | Admitting: Internal Medicine

## 2014-07-04 ENCOUNTER — Other Ambulatory Visit: Payer: Self-pay | Admitting: Internal Medicine

## 2014-08-26 ENCOUNTER — Other Ambulatory Visit: Payer: Self-pay

## 2014-08-26 MED ORDER — AZELASTINE HCL 0.1 % NA SOLN: 1.0000 | Freq: Two times a day (BID) | NASAL | Status: DC

## 2014-08-26 MED ORDER — TRIAMTERENE-HCTZ 37.5-25 MG PO TABS: 1.0000 | ORAL_TABLET | Freq: Every day | ORAL | Status: DC

## 2014-08-31 ENCOUNTER — Telehealth: Payer: Self-pay | Admitting: Internal Medicine

## 2014-08-31 NOTE — Telephone Encounter (Signed)
Please offer appt tomorrow-- I still have 12:15 and 12:30

## 2014-08-31 NOTE — Telephone Encounter (Signed)
PLEASE NOTE: All timestamps contained within this report are represented as [place] Standard Time. CONFIDENTIALTY NOTICE: This fax transmission is intended only for the addressee. It contains information that is legally privileged, confidential or otherwise protected from use or disclosure. If you are not the intended recipient, you are strictly prohibited from reviewing, disclosing, copying using or disseminating any of this information or taking any action in reliance on or regarding this information. If you have received this fax in error, please notify us immediately by telephone so that we can arrange for its return to [place]. Phone: [phone], Toll-Free: [phone], Fax: [phone] Page: 1 of 2 Call Id: [id number] [place] Patient Name: Madison Stout Gender: Female DOB: Jan 20, 1951 Age: 64 Y 10 M 26 D Return Phone Number: [phone] (Primary), [phone] (Secondary) Address: City/State/Zip: [place] [address] Client [facility] - Client Client Site [facility] - Day Physician Viviana Simpler Contact Type Call Call Type Triage / Clinical Relationship To Patient Self Appointment Disposition EMR Appointment Attempted - Not Scheduled Return Phone Number [phone] (Primary) Chief Complaint NUMBNESS - sudden on one side of face or body Initial Comment Caller states she has pain in right shoulder, back and breast/rib area, became right side numbness when laying down PreDisposition Call Doctor Info pasted into Epic Yes Nurse Assessment Nurse: Ronnald Ramp, RN, Miranda Date/Time (Eastern Time): 08/31/2014 9:10:14 AM Confirm and document reason for call. If symptomatic, describe symptoms. ---Caller states she has been having pain in her right shoulder, arm, breast, and rib cage for a couple of weeks. Also having numbness down her right side Has  the patient traveled out of the country within the last 30 days? ---Not Applicable Does the patient require triage? ---Yes Related visit to physician within the last 2 weeks? ---No Does the PT have any chronic conditions? (i.e. diabetes, asthma, etc.) ---Yes List chronic conditions. ---Asthma, HTN Guidelines Guideline Title Affirmed Question Affirmed Notes Nurse Date/Time Eilene Ghazi Time) Shoulder Pain Weakness (i.e., loss of strength) in hand or fingers (Exception: not truly weak; hand feels weak because of pain) Ronnald Ramp, RN, Miranda 08/31/2014 9:11:59 AM Disp. Time Eilene Ghazi Time) Disposition Final User 08/31/2014 9:06:11 AM Send to Urgent Queue Dalia Heading 08/31/2014 9:13:39 AM See Physician within 4 Hours (or PCP triage) Yes Ronnald Ramp, RN, Miranda PLEASE NOTE: All timestamps contained within this report are represented as [place] Standard Time. CONFIDENTIALTY NOTICE: This fax transmission is intended only for the addressee. It contains information that is legally privileged, confidential or otherwise protected from use or disclosure. If you are not the intended recipient, you are strictly prohibited from reviewing, disclosing, copying using or disseminating any of this information or taking any action in reliance on or regarding this information. If you have received this fax in error, please notify us immediately by telephone so that we can arrange for its return to [place]. Phone: [phone], Toll-Free: [phone], Fax: [phone] Page: 2 of 2 Call Id: [id number] [place] Understands: Yes Disagree/Comply: Comply Care Advice Given Per Guideline SEE PHYSICIAN WITHIN 4 HOURS (or PCP triage): PAIN MEDICINES: ACETAMINOPHEN (E.G., TYLENOL): IBUPROFEN (E.G., MOTRIN, ADVIL): NAPROXEN (E.G., ALEVE): * You become worse. CALL BACK IF: CARE ADVICE given per Shoulder Pain (Adult) guideline After Care Instructions Given Call Event Type User Date / Time Description Comments User: Leverne Humbles, RN  Date/Time (Eastern Time): 08/31/2014 9:20:34 AM No appt available within time frame, offered to look for an appt  at another location and caller declined. Told caller the other option would be to go to ED or UC. Caller states she just wants an appt with her doctor. Told caller I would make a note on the chart that she is requesting appt tomorrow. Referrals REFERRED TO PCP OFFICE

## 2014-08-31 NOTE — Telephone Encounter (Signed)
[facility] Patient Name: Madison Stout DOB: [birth year] Initial Comment Caller states she has pain in right shoulder, back and breast/rib area, became right side numbness when laying down Nurse Assessment Nurse: Ronnald Ramp, RN, Miranda Date/Time (Eastern Time): 08/31/2014 9:10:14 AM Confirm and document reason for call. If symptomatic, describe symptoms. ---Caller states she has been having pain in her right shoulder, arm, breast, and rib cage for a couple of weeks. Also having numbness down her right side Has the patient traveled out of the country within the last 30 days? ---Not Applicable Does the patient require triage? ---Yes Related visit to physician within the last 2 weeks? ---No Does the PT have any chronic conditions? (i.e. diabetes, asthma, etc.) ---Yes List chronic conditions. ---Asthma, HTN Guidelines Guideline Title Affirmed Question Affirmed Notes Shoulder Pain Weakness (i.e., loss of strength) in hand or fingers (Exception: not truly weak; hand feels weak because of pain) Final Disposition User See Physician within 4 Hours (or PCP triage) Ronnald Ramp, RN, Miranda Comments No appt available within time frame, offered to look for an appt at another location and caller declined. Told caller the other option would be to go to ED or UC. Caller states she just wants an appt with her doctor. Told caller I would make a note on the chart that she is requesting appt tomorrow.

## 2014-08-31 NOTE — Telephone Encounter (Signed)
I spoke with patient and scheduled appointment on 09/01/14 @ 12:30.

## 2014-09-01 ENCOUNTER — Ambulatory Visit (INDEPENDENT_AMBULATORY_CARE_PROVIDER_SITE_OTHER)
Admission: RE | Admit: 2014-09-01 | Discharge: 2014-09-01 | Disposition: A | Discharge status: Home / Self Care | Payer: BLUE CROSS/BLUE SHIELD | Source: Ambulatory Visit | Attending: Internal Medicine | Admitting: Internal Medicine

## 2014-09-01 ENCOUNTER — Encounter: Payer: Self-pay | Admitting: Internal Medicine

## 2014-09-01 ENCOUNTER — Ambulatory Visit (INDEPENDENT_AMBULATORY_CARE_PROVIDER_SITE_OTHER): Payer: BLUE CROSS/BLUE SHIELD | Admitting: Internal Medicine

## 2014-09-01 VITALS — BP 118/74 | HR 72 | Temp 98.6°F | Wt 218.5 lb

## 2014-09-01 DIAGNOSIS — M7541 Impingement syndrome of right shoulder: Secondary | ICD-10-CM

## 2014-09-01 NOTE — Assessment & Plan Note (Signed)
Doesn't appear to have rotator cuff tear but probably inflammation causing swelling Some hand/arm weakness but seems to be from pain---nothing to suggest cervical radiculopathy by history and exam otherwise Discussed options Will check x-ray Increase dose of aleve If not better in 1-2 weeks, consider cortisone shot

## 2014-09-01 NOTE — Patient Instructions (Signed)
Please increase the aleve to 2 tabs twice a day. Continue the heat If you are not better in 1-2 weeks, call for appointment for me to put cortisone shot in the shoulder

## 2014-09-01 NOTE — Progress Notes (Signed)
Subjective:    Patient ID: Madison Stout, female    DOB: 10/01/1950, 64 y.o.   MRN: [id number]  HPI Here with husband Having bad pain in right shoulder--moves along clavicle and upper back and neck No apparent injury Started 2-3 weeks ago  Arm goes numb at night Pain is worse at night Pain generally concentrated in shoulder--doesn't go below elbow for the most part Limited ROM  Has tried aleve, ibuprofen, tylenol--some help. Aleve is best Heat helps some  Right arm feels weak--can't hold it up  Current Outpatient Prescriptions on File Prior to Visit  Medication Sig Dispense Refill  . triamterene-hydrochlorothiazide (MAXZIDE-25) 37.5-25 MG per tablet Take 1 tablet by mouth daily. 90 tablet 0  . albuterol (PROVENTIL HFA;VENTOLIN HFA) 108 (90 BASE) MCG/ACT inhaler Inhale 2 puffs into the lungs every 6 (six) hours as needed. 8.5 g 11  . aspirin 81 MG tablet Take 81 mg by mouth daily.      Marland Kitchen azelastine (ASTELIN) 0.1 % nasal spray Place 1 spray into both nostrils 2 (two) times daily. 30 mL 1  . Bromelain 250 MG CAPS Take 1 capsule by mouth daily.    . cetirizine (ZYRTEC) 10 MG tablet Take 20 mg by mouth daily.      . clobetasol (OLUX) 0.05 % topical foam Apply topically 2 (two) times daily as needed. 50 g 0  . fluticasone (FLONASE) 50 MCG/ACT nasal spray USE TWO SPRAY IN EACH NOSTRIL ONCE DAILY 16 g 11  . loratadine (CLARITIN) 10 MG tablet Take 10 mg by mouth daily.      . traMADol (ULTRAM) 50 MG tablet TAKE ONE TABLET BY MOUTH EVERY 8 HOURS AS NEEDED (Patient not taking: Reported on 09/01/2014) 30 tablet 0   No current facility-administered medications on file prior to visit.    Allergies  Allergen Reactions  . Clindamycin/Lincomycin     Chest felt tight, breathing off  . Ampicillin   . Penicillins     Past Medical History  Diagnosis Date  . Allergy   . Asthma   . Hypertension     Past Surgical History  Procedure Laterality Date  . Cesarean section    . Myomectomy   2005    removed  . Kidney stone surgery  12/04    Family History  Problem Relation Age of Onset  . Dementia Mother   . Hypertension Mother   . Diabetes Mother   . Cancer Mother     breast    History   Social History  . Marital Status: Married    Spouse Name: N/A  . Number of Children: 1  . Years of Education: N/A   Occupational History  . Supervisor [employer]   Social History Main Topics  . Smoking status: Never Smoker   . Smokeless tobacco: Never Used  . Alcohol Use: No  . Drug Use: No  . Sexual Activity: Not on file   Other Topics Concern  . Not on file   Social History Narrative   Review of Systems Different job at [employer] heavy [employer]. This has worsened her shoulder Will eventually get to sleep    Objective:   Physical Exam  Neck: Normal range of motion. Neck supple. No thyromegaly present.  No tenderness  Musculoskeletal:  Able to actively abduct right arm to ~60 degrees Fair internal rotation but markedly restricted external rotation No localized tenderness  Lymphadenopathy:    She has no cervical adenopathy.  Assessment & Plan:

## 2014-09-01 NOTE — Progress Notes (Signed)
Pre visit review using our clinic review tool, if applicable. No additional management support is needed unless otherwise documented below in the visit note. 

## 2014-09-04 MED ORDER — AZELASTINE HCL 0.1 % NA SOLN: 1.0000 | Freq: Two times a day (BID) | NASAL | Status: DC

## 2014-09-04 MED ORDER — TRIAMTERENE-HCTZ 37.5-25 MG PO TABS: 1.0000 | ORAL_TABLET | Freq: Every day | ORAL | Status: DC

## 2014-09-04 NOTE — Addendum Note (Signed)
Addended by: Lurlean Nanny on: 09/04/2014 10:20 AM   Modules accepted: Orders

## 2014-09-16 ENCOUNTER — Telehealth: Payer: Self-pay

## 2014-09-16 NOTE — Telephone Encounter (Signed)
Mrs Sheppard called and requested all pts refills to go to [facility] [address] [facility] [place]. Spoke with Eb[facility] at [facility] [address] and [facility] [address] [facility]. Mrs Tyler voiced understanding. 

## 2014-09-28 ENCOUNTER — Ambulatory Visit (INDEPENDENT_AMBULATORY_CARE_PROVIDER_SITE_OTHER): Payer: BLUE CROSS/BLUE SHIELD | Admitting: Family Medicine

## 2014-09-28 ENCOUNTER — Encounter: Payer: Self-pay | Admitting: Family Medicine

## 2014-09-28 VITALS — BP 120/72 | HR 80 | Temp 98.3°F | Ht 65.0 in | Wt 217.2 lb

## 2014-09-28 DIAGNOSIS — M7501 Adhesive capsulitis of right shoulder: Secondary | ICD-10-CM

## 2014-09-28 MED ORDER — METHYLPREDNISOLONE ACETATE 40 MG/ML IJ SUSP
80.0000 mg | Freq: Once | INTRAMUSCULAR | Status: AC
  Administered 2014-09-28: 80 mg via INTRA_ARTICULAR

## 2014-09-28 NOTE — Progress Notes (Signed)
Dr. Karleen Hampshire T. Kyana Aicher, MD, CAQ [facility] [address] [place], [address] Phone: [phone] Fax: [phone]  09/28/2014  Patient: Madison Stout, MRN: [id number], DOB: [birth year], 64 y.o.  Primary Physician:  Tillman Abide, MD  Chief Complaint: Shoulder Pain  Subjective:   Madison Stout is a 64 y.o. very pleasant female patient who presents with the following: shoulder pain  The patient noted above presents with shoulder pain that has been ongoing for 5 weeks there is no history of trauma or accident. The patient denies neck pain or radicular symptoms. No shoulder blade pain Denies dislocation, subluxation, separation of the shoulder. The patient does complain of pain with flexion, abduction, and terminal motion.  Significant restriction of motion. she describes a deep ache around the shoulder, and sometimes it will wake the patient up at night.  Medications Tried: otc nsaids, alleve Ice or Heat: minimal help Tried PT: No  Prior shoulder Injury: No Prior surgery: No Prior fracture: No  Past Medical History, Surgical History, Social History, Family History, Medications, and allergies reviewed and updated if relevant.   GEN: No fevers, chills. Nontoxic. Primarily MSK c/o today. MSK: Detailed in the HPI GI: tolerating PO intake without difficulty Neuro: No numbness, parasthesias, or tingling associated. Otherwise the pertinent positives of the ROS are noted above.    Objective:   Blood pressure 120/72, pulse 80, temperature 98.3 F (36.8 C), temperature source Oral, height 5\' 5"  (1.651 m), weight 217 lb 4 oz (98.544 kg).  GEN: Well-developed,well-nourished,in no acute distress; alert,appropriate and cooperative throughout examination HEENT: Normocephalic and atraumatic without obvious abnormalities. Ears, externally no deformities PULM: Breathing comfortably in no respiratory distress EXT: No clubbing, cyanosis, or  edema PSYCH: Normally interactive. Cooperative during the interview. Pleasant. Friendly and conversant. Not anxious or depressed appearing. Normal, full affect.  Shoulder: R Inspection: No muscle wasting or winging Ecchymosis/edema: neg  AC joint, scapula, clavicle: mild ttp at ac Cervical spine: NT, full ROM Spurling's: neg Abduction: 5/5, 105 Flexion: 5/5, 110 IR, full, lift-off: 5/5, none at 90 ER at neutral: 5/5, none at 90 AC crossover and compression: unable to complete Additional special testing is equivocal given lack of motion C5-T1 intact Sensation intact Grip 5/5  Assessment and Plan:   Frozen shoulder, right - Plan: methylPREDNISolone acetate (DEPO-MEDROL) injection 80 mg  >25 minutes spent in face to face time with patient, >50% spent in counselling or coordination of care: Classic adhesive capsulitis. Patient was given a systematic ROM protocol from [facility] to be done daily. Emphasized importance of adherence, help of PT, daily HEP.  The average length of total symptoms is 12 months going through 3 different phases in the freezing and thawing process. Reviewed all with patient.   Tylenol or NSAID of choice prn for pain relief Intraarticular shoulder injections discussed with patient, which have good evidence for accelerating the thawing phase.  Patient will be sent for formal PT for aggressive frozen shoulder ROM - pt declined. Will need RTC str and scapular stabilization to fix underlying mechanics.  Intrarticular Shoulder Injection, R Verbal consent was obtained from the patient. Risks including infection explained and contrasted with benefits and alternatives. Patient prepped with Chloraprep and Ethyl Chloride used for anesthesia. An intraarticular shoulder injection was performed using the posterior approach. The patient tolerated the procedure well and had decreased pain post injection. No complications. Injection: 8 cc of Lidocaine 1% and Depo-Medrol 80  mg. Needle: 22 gauge, 2 inch  Follow-up: Return in about 2 months (around 11/28/2014).  New Prescriptions   No medications on file   No orders of the defined types were placed in this encounter.    Signed,  Madison Galea. [name], MD   Patient's Medications  New Prescriptions   No medications on file  Previous Medications   ALBUTEROL (PROVENTIL HFA;VENTOLIN HFA) 108 (90 BASE) MCG/ACT INHALER    Inhale 2 puffs into the lungs every 6 (six) hours as needed.   ASPIRIN 81 MG TABLET    Take 81 mg by mouth daily.     AZELASTINE (ASTELIN) 0.1 % NASAL SPRAY    Place 1 spray into both nostrils 2 (two) times daily.   BROMELAIN 250 MG CAPS    Take 1 capsule by mouth daily.   CETIRIZINE (ZYRTEC) 10 MG TABLET    Take 20 mg by mouth daily.     CLOBETASOL (OLUX) 0.05 % TOPICAL FOAM    Apply topically 2 (two) times daily as needed.   FLUTICASONE (FLONASE) 50 MCG/ACT NASAL SPRAY    USE TWO SPRAY IN EACH NOSTRIL ONCE DAILY   LORATADINE (CLARITIN) 10 MG TABLET    Take 10 mg by mouth daily.     NAPROXEN SODIUM (ANAPROX) 220 MG TABLET    Take 440 mg by mouth 2 (two) times daily with a meal.   TRAMADOL (ULTRAM) 50 MG TABLET    TAKE ONE TABLET BY MOUTH EVERY 8 HOURS AS NEEDED   TRIAMTERENE-HYDROCHLOROTHIAZIDE (MAXZIDE-25) 37.5-25 MG PER TABLET    Take 1 tablet by mouth daily.  Modified Medications   No medications on file  Discontinued Medications   No medications on file

## 2014-09-28 NOTE — Progress Notes (Signed)
Pre visit review using our clinic review tool, if applicable. No additional management support is needed unless otherwise documented below in the visit note. 

## 2014-10-12 ENCOUNTER — Telehealth: Payer: Self-pay | Admitting: *Deleted

## 2014-10-12 MED ORDER — TRIAMTERENE-HCTZ 37.5-25 MG PO TABS: 1.0000 | ORAL_TABLET | Freq: Every day | ORAL | Status: DC

## 2014-10-12 MED ORDER — ALBUTEROL SULFATE HFA 108 (90 BASE) MCG/ACT IN AERS: 2.0000 | INHALATION_SPRAY | Freq: Four times a day (QID) | RESPIRATORY_TRACT | Status: DC | PRN

## 2014-10-12 NOTE — Telephone Encounter (Signed)
Patient left a voicemail stating that she is changing her pharmacy to [facility] and needs refills on her Ventolin and Maxzide. Let patient know when this has been taken care of.

## 2014-10-12 NOTE — Telephone Encounter (Signed)
rx sent to pharmacy by e-script Sent patient message back thru my-chart, advised to call or resend message if pt has any questions.

## 2014-11-30 ENCOUNTER — Encounter: Payer: Self-pay | Admitting: Family Medicine

## 2014-11-30 ENCOUNTER — Ambulatory Visit (INDEPENDENT_AMBULATORY_CARE_PROVIDER_SITE_OTHER): Payer: No Typology Code available for payment source | Admitting: Family Medicine

## 2014-11-30 VITALS — BP 100/72 | HR 90 | Temp 98.1°F | Ht 65.0 in | Wt 214.0 lb

## 2014-11-30 DIAGNOSIS — M7501 Adhesive capsulitis of right shoulder: Secondary | ICD-10-CM

## 2014-11-30 MED ORDER — TRAMADOL HCL 50 MG PO TABS: 50.0000 mg | ORAL_TABLET | Freq: Three times a day (TID) | ORAL | Status: DC | PRN

## 2014-11-30 NOTE — Progress Notes (Signed)
Dr. Karleen Hampshire T. Jolanta Cabeza, MD, CAQ Sports Medicine Primary Care and Sports Medicine [address] [place], [address] Phone: [phone] Fax: [phone]  11/30/2014  Patient: Madison Stout, MRN: [id number], DOB: 02-11-1951, 64 y.o.  Primary Physician:  Tillman Abide, MD  Chief Complaint: Follow-up  Subjective:   Madison Stout is a 64 y.o. very pleasant female patient who presents with the following: shoulder pain  F/u R frozen shoulder, s/p intraarticular injection. HEP - not so compliant.. She was unable to go to any formal physical therapy.  Per her husband she has been fairly poor and her compliance with her home exercise program that I gave her from [place].  She still is having some significant pain at night.  09/28/2014 Last OV with Hannah Beat, MD  The patient noted above presents with shoulder pain that has been ongoing for 5 weeks there is no history of trauma or accident. The patient denies neck pain or radicular symptoms. No shoulder blade pain Denies dislocation, subluxation, separation of the shoulder. The patient does complain of pain with flexion, abduction, and terminal motion.  Significant restriction of motion. she describes a deep ache around the shoulder, and sometimes it will wake the patient up at night.  Medications Tried: otc nsaids, alleve Ice or Heat: minimal help Tried PT: No  Prior shoulder Injury: No Prior surgery: No Prior fracture: No  Past Medical History, Surgical History, Social History, Family History, Medications, and allergies reviewed and updated if relevant.   GEN: No fevers, chills. Nontoxic. Primarily MSK c/o today. MSK: Detailed in the HPI GI: tolerating PO intake without difficulty Neuro: No numbness, parasthesias, or tingling associated. Otherwise the pertinent positives of the ROS are noted above.    Objective:   Blood pressure 100/72, pulse 90, temperature 98.1 F (36.7 C), temperature source Oral, height 5\' 5"   (1.651 m), weight 214 lb (97.07 kg).  GEN: Well-developed,well-nourished,in no acute distress; alert,appropriate and cooperative throughout examination HEENT: Normocephalic and atraumatic without obvious abnormalities. Ears, externally no deformities PULM: Breathing comfortably in no respiratory distress EXT: No clubbing, cyanosis, or edema PSYCH: Normally interactive. Cooperative during the interview. Pleasant. Friendly and conversant. Not anxious or depressed appearing. Normal, full affect.  Shoulder: R Inspection: No muscle wasting or winging Ecchymosis/edema: neg  AC joint, scapula, clavicle: mild ttp at ac Cervical spine: NT, full ROM Spurling's: neg Abduction: 5/5, 95 Flexion: 5/5, 90 IR, full, lift-off: 5/5, none at 90 ER at neutral: 5/5, none at 90 AC crossover and compression: unable to complete Additional special testing is equivocal given lack of motion C5-T1 intact Sensation intact Grip 5/5  Assessment and Plan:   Frozen shoulder, right  Overall, not doing well.  Now approximately 3 months into her frozen shoulder.  We reviewed anatomy again, progression.  Again reviewed different rehabilitation with the program, and I gave her a rehabilitation program from the [facility] system, and hopefully a different viewpoint may spark an interest in doing her home rehabilitation.  I tried to answer all of her questions to the best of my ability.  Given limitations, this may be a long road.  Refill tramadol for pain prn.  Follow-up: Return in about 3 months (around 03/02/2015).  Signed,  Elpidio Galea. Kameren Pargas, MD   Patient's Medications  New Prescriptions   No medications on file  Previous Medications   ALBUTEROL (PROVENTIL HFA;VENTOLIN HFA) 108 (90 BASE) MCG/ACT INHALER    Inhale 2 puffs into the lungs every 6 (six) hours as  needed.   ASPIRIN 81 MG TABLET    Take 81 mg by mouth daily.     AZELASTINE (ASTELIN) 0.1 % NASAL SPRAY    Place 1 spray into both  nostrils 2 (two) times daily.   BROMELAIN 250 MG CAPS    Take 1 capsule by mouth daily.   CETIRIZINE (ZYRTEC) 10 MG TABLET    Take 20 mg by mouth daily.     CLOBETASOL (OLUX) 0.05 % TOPICAL FOAM    Apply topically 2 (two) times daily as needed.   FLUTICASONE (FLONASE) 50 MCG/ACT NASAL SPRAY    USE TWO SPRAY IN EACH NOSTRIL ONCE DAILY   LORATADINE (CLARITIN) 10 MG TABLET    Take 10 mg by mouth daily.     NAPROXEN SODIUM (ANAPROX) 220 MG TABLET    Take 440 mg by mouth 2 (two) times daily with a meal.   TRIAMTERENE-HYDROCHLOROTHIAZIDE (MAXZIDE-25) 37.5-25 MG PER TABLET    Take 1 tablet by mouth daily.  Modified Medications   Modified Medication Previous Medication   TRAMADOL (ULTRAM) 50 MG TABLET traMADol (ULTRAM) 50 MG tablet      Take 1 tablet (50 mg total) by mouth every 8 (eight) hours as needed.    TAKE ONE TABLET BY MOUTH EVERY 8 HOURS AS NEEDED  Discontinued Medications   No medications on file

## 2014-11-30 NOTE — Progress Notes (Signed)
Pre visit review using our clinic review tool, if applicable. No additional management support is needed unless otherwise documented below in the visit note. 

## 2015-02-19 ENCOUNTER — Telehealth: Payer: Self-pay | Admitting: Internal Medicine

## 2015-02-19 ENCOUNTER — Encounter: Payer: Self-pay | Admitting: Internal Medicine

## 2015-02-19 ENCOUNTER — Ambulatory Visit (INDEPENDENT_AMBULATORY_CARE_PROVIDER_SITE_OTHER): Payer: No Typology Code available for payment source | Admitting: Internal Medicine

## 2015-02-19 VITALS — BP 122/70 | HR 83 | Temp 97.5°F | Wt 218.0 lb

## 2015-02-19 DIAGNOSIS — I10 Essential (primary) hypertension: Secondary | ICD-10-CM | POA: Diagnosis not present

## 2015-02-19 DIAGNOSIS — R5383 Other fatigue: Secondary | ICD-10-CM | POA: Diagnosis not present

## 2015-02-19 LAB — LIPID PANEL
CHOL/HDL RATIO: 4
Cholesterol: 189 mg/dL (ref 0–200)
HDL: 50.1 mg/dL (ref >39.00)
LDL Cholesterol: 117 mg/dL — ABNORMAL HIGH (ref 0–99)
NonHDL: 138.75
TRIGLYCERIDES: 108 mg/dL (ref 0.0–149.0)
VLDL: 21.6 mg/dL (ref 0.0–40.0)

## 2015-02-19 LAB — T4, FREE: Free T4: 0.74 ng/dL (ref 0.60–1.60)

## 2015-02-19 LAB — COMPREHENSIVE METABOLIC PANEL
ALBUMIN: 4 g/dL (ref 3.5–5.2)
ALK PHOS: 70 U/L (ref 39–117)
ALT: 11 U/L (ref 0–35)
AST: 14 U/L (ref 0–37)
BUN: 21 mg/dL (ref 6–23)
CO2: 29 mEq/L (ref 19–32)
Calcium: 9.7 mg/dL (ref 8.4–10.5)
Chloride: 100 mEq/L (ref 96–112)
Creatinine, Ser: 1.08 mg/dL (ref 0.40–1.20)
GFR: 66.03 mL/min (ref >60.00)
Glucose, Bld: 97 mg/dL (ref 70–99)
POTASSIUM: 3.6 meq/L (ref 3.5–5.1)
SODIUM: 138 meq/L (ref 135–145)
Total Bilirubin: 0.5 mg/dL (ref 0.2–1.2)
Total Protein: 7.7 g/dL (ref 6.0–8.3)

## 2015-02-19 LAB — CBC WITH DIFFERENTIAL/PLATELET
Basophils Absolute: 0 10*3/uL (ref 0.0–0.1)
Basophils Relative: 0.5 % (ref 0.0–3.0)
EOS PCT: 3.8 % (ref 0.0–5.0)
Eosinophils Absolute: 0.2 10*3/uL (ref 0.0–0.7)
HCT: 38.3 % (ref 36.0–46.0)
HEMOGLOBIN: 12.7 g/dL (ref 12.0–15.0)
LYMPHS ABS: 1.6 10*3/uL (ref 0.7–4.0)
Lymphocytes Relative: 33.4 % (ref 12.0–46.0)
MCHC: 33.1 g/dL (ref 30.0–36.0)
MCV: 78.8 fl (ref 78.0–100.0)
Monocytes Absolute: 0.3 10*3/uL (ref 0.1–1.0)
Monocytes Relative: 5.8 % (ref 3.0–12.0)
NEUTROS ABS: 2.7 10*3/uL (ref 1.4–7.7)
Neutrophils Relative %: 56.5 % (ref 43.0–77.0)
PLATELETS: 190 10*3/uL (ref 150.0–400.0)
RBC: 4.86 Mil/uL (ref 3.87–5.11)
RDW: 14.2 % (ref 11.5–15.5)
WBC: 4.8 10*3/uL (ref 4.0–10.5)

## 2015-02-19 NOTE — Telephone Encounter (Signed)
Scheduled 05/24/15

## 2015-02-19 NOTE — Assessment & Plan Note (Signed)
BP Readings from Last 3 Encounters:  02/19/15 122/70  11/30/14 100/72  09/28/14 120/72   May need to consider a trial off diuretic if energy is not improving She can try 1/2 tab daily during the hot weather

## 2015-02-19 NOTE — Telephone Encounter (Signed)
Pt insurance is wanting her to have a cpe before the end of the year. Next cpe slot is March 2017. Can pt be worked in before end of 2016?

## 2015-02-19 NOTE — Progress Notes (Signed)
Subjective:    Patient ID: Madison Stout, female    DOB: Mar 30, 1951, 64 y.o.   MRN: [id number]  HPI Here due to fatigue  "My energy level has gone from 90-0" Goes back since March She thinks she could sleep for days  Appetite is off--like taste is off Having cramps in right foot  Right shoulder slightly better but still not good  Heart has fluttered at times Did have sensation of heart stopping while asleep with arms crossed-- woke and had to take breath No problems on treadmill at gym No dizziness but rare slight lightheaded feeling (?if moving too fast)  Current Outpatient Prescriptions on File Prior to Visit  Medication Sig Dispense Refill  . albuterol (PROVENTIL HFA;VENTOLIN HFA) 108 (90 BASE) MCG/ACT inhaler Inhale 2 puffs into the lungs every 6 (six) hours as needed. 8.5 g 11  . aspirin 81 MG tablet Take 81 mg by mouth daily.      Marland Kitchen azelastine (ASTELIN) 0.1 % nasal spray Place 1 spray into both nostrils 2 (two) times daily. 30 mL 1  . clobetasol (OLUX) 0.05 % topical foam Apply topically 2 (two) times daily as needed. 50 g 0  . fluticasone (FLONASE) 50 MCG/ACT nasal spray USE TWO SPRAY IN EACH NOSTRIL ONCE DAILY 16 g 11  . traMADol (ULTRAM) 50 MG tablet Take 1 tablet (50 mg total) by mouth every 8 (eight) hours as needed. 50 tablet 1  . triamterene-hydrochlorothiazide (MAXZIDE-25) 37.5-25 MG per tablet Take 1 tablet by mouth daily. 90 tablet 3   No current facility-administered medications on file prior to visit.    Allergies  Allergen Reactions  . Clindamycin/Lincomycin     Chest felt tight, breathing off  . Ampicillin   . Penicillins     Past Medical History  Diagnosis Date  . Allergy   . Asthma   . Hypertension     Past Surgical History  Procedure Laterality Date  . Cesarean section    . Myomectomy  2005    removed  . Kidney stone surgery  12/04    Family History  Problem Relation Age of Onset  . Dementia Mother   . Hypertension Mother   .  Diabetes Mother   . Cancer Mother     breast    Social History   Social History  . Marital Status: Married    Spouse Name: N/A  . Number of Children: 1  . Years of Education: N/A   Occupational History  . Supervisor [employer]   Social History Main Topics  . Smoking status: Never Smoker   . Smokeless tobacco: Never Used  . Alcohol Use: No  . Drug Use: No  . Sexual Activity: Not on file   Other Topics Concern  . Not on file   Social History Narrative   Review of Systems Retired in April Does try to go to gym--keeps up yard. Considering part time job Weight is about the same--had lost 10# at first upon retirement, but gained it back Always sweats bad Still has some leg pain--but overall better    Objective:   Physical Exam  Constitutional: She is oriented to person, place, and time. She appears well-developed and well-nourished. No distress.  HENT:  Mouth/Throat: Oropharynx is clear and moist. No oropharyngeal exudate.  Neck: Normal range of motion. Neck supple. No thyromegaly present.  Cardiovascular: Normal rate, regular rhythm, normal heart sounds and intact distal pulses.  Exam reveals no gallop.   No murmur heard. Pulmonary/Chest: Effort  normal and breath sounds normal. No respiratory distress. She has no wheezes. She has no rales.  Abdominal: Soft. There is no tenderness.  Musculoskeletal: She exhibits no edema or tenderness.  Lymphadenopathy:    She has no cervical adenopathy.  Neurological: She is alert and oriented to person, place, and time.  Skin: No rash noted. No erythema.  Psychiatric: She has a normal mood and affect. Her behavior is normal.          Assessment & Plan:

## 2015-02-19 NOTE — Telephone Encounter (Signed)
Just find a 67min spot

## 2015-02-19 NOTE — Assessment & Plan Note (Signed)
Seems to be from retirement, not getting out of bed in the morning with alarm, lying back down for sleep during the day, etc No PE findings of concern Will check labs Discussed being more busy--part time work, Psychologist, occupational, Social research officer, government

## 2015-02-19 NOTE — Patient Instructions (Signed)
Try just 1/2 of the blood pressure pill daily till the heat lets up.

## 2015-03-03 ENCOUNTER — Ambulatory Visit (INDEPENDENT_AMBULATORY_CARE_PROVIDER_SITE_OTHER): Payer: No Typology Code available for payment source | Admitting: Family Medicine

## 2015-03-03 ENCOUNTER — Encounter: Payer: Self-pay | Admitting: Family Medicine

## 2015-03-03 VITALS — BP 108/72 | HR 100 | Temp 98.6°F | Ht 65.0 in | Wt 220.5 lb

## 2015-03-03 DIAGNOSIS — M7501 Adhesive capsulitis of right shoulder: Secondary | ICD-10-CM

## 2015-03-03 MED ORDER — TRAMADOL HCL 50 MG PO TABS: 50.0000 mg | ORAL_TABLET | Freq: Three times a day (TID) | ORAL | Status: DC | PRN

## 2015-03-03 NOTE — Progress Notes (Signed)
Dr. Karleen Hampshire T. Priest Lockridge, MD, CAQ Sports Medicine Primary [facility] and Sports Medicine [address] [place], [address] Phone: [phone] Fax: [phone]  03/03/2015  Patient: Madison Stout, MRN: [id number], DOB: [birth year], 64 y.o.  Primary Physician:  Tillman Abide, MD  Chief Complaint: Follow-up  Subjective:   Madison Stout is a 64 y.o. very pleasant female patient who presents with the following: shoulder pain  F/y frozen shoulder: she has done better with being compliant with her home exercise program, and she thinks that she is improved.  Her shoulders hurting less.  She has been unable to go to physical therapy.  She is compliant with her [facility] program.  She is encouraged at this point, but understands her shoulder is nowhere near back to normal.  11/30/2014 Last OV with Hannah Beat, MD  F/u R frozen shoulder, s/p intraarticular injection. HEP - not so compliant.. She was unable to go to any formal physical therapy.  Per her husband she has been fairly poor and her compliance with her home exercise program that I gave her from [place].  She still is having some significant pain at night.  09/28/2014 Last OV with Hannah Beat, MD  The patient noted above presents with shoulder pain that has been ongoing for 5 weeks there is no history of trauma or accident. The patient denies neck pain or radicular symptoms. No shoulder blade pain Denies dislocation, subluxation, separation of the shoulder. The patient does complain of pain with flexion, abduction, and terminal motion.  Significant restriction of motion. she describes a deep ache around the shoulder, and sometimes it will wake the patient up at night.  Medications Tried: otc nsaids, alleve Ice or Heat: minimal help Tried PT: No  Prior shoulder Injury: No Prior surgery: No Prior fracture: No  Past Medical History, Surgical History, Social History, Family History, Medications, and allergies reviewed and  updated if relevant.   GEN: No fevers, chills. Nontoxic. Primarily MSK c/o today. MSK: Detailed in the HPI GI: tolerating PO intake without difficulty Neuro: No numbness, parasthesias, or tingling associated. Otherwise the pertinent positives of the ROS are noted above.    Objective:   Blood pressure 108/72, pulse 100, temperature 98.6 F (37 C), temperature source Oral, height 5\' 5"  (1.651 m), weight 220 lb 8 oz (100.018 kg).  GEN: Well-developed,well-nourished,in no acute distress; alert,appropriate and cooperative throughout examination HEENT: Normocephalic and atraumatic without obvious abnormalities. Ears, externally no deformities PULM: Breathing comfortably in no respiratory distress EXT: No clubbing, cyanosis, or edema PSYCH: Normally interactive. Cooperative during the interview. Pleasant. Friendly and conversant. Not anxious or depressed appearing. Normal, full affect.  Shoulder: R Inspection: No muscle wasting or winging Ecchymosis/edema: neg  AC joint, scapula, clavicle: mild ttp at ac Cervical spine: NT, full ROM Spurling's: neg Abduction: 5/5, 110 Flexion: 5/5, 105 IR, full, lift-off: 5/5, 15 deg at 90 ER at neutral: 5/5, 20 deg at 90 AC crossover and compression: unable to complete Additional special testing is equivocal given lack of motion C5-T1 intact Sensation intact Grip 5/5  Assessment and Plan:   Frozen shoulder, right  She is improving.  Classically this disease will take somewhere between 12 and 18 months to fully recover.  Refill Ultram  She has PCP f/u physical in 3 months. Should be progressing slowly along. Continue daily HEP. F/u with me if continued difficulties.  Signed,  Elpidio Galea. Llewellyn Schoenberger, MD   Patient's Medications  New Prescriptions   No medications on file  Previous  Medications   ALBUTEROL (PROVENTIL HFA;VENTOLIN HFA) 108 (90 BASE) MCG/ACT INHALER    Inhale 2 puffs into the lungs every 6 (six) hours as needed.   ASPIRIN 81 MG  TABLET    Take 81 mg by mouth daily.     AZELASTINE (ASTELIN) 0.1 % NASAL SPRAY    Place 1 spray into both nostrils 2 (two) times daily.   CLOBETASOL (OLUX) 0.05 % TOPICAL FOAM    Apply topically 2 (two) times daily as needed.   FEXOFENADINE (ALLEGRA) 180 MG TABLET    Take 180 mg by mouth daily.   FLUTICASONE (FLONASE) 50 MCG/ACT NASAL SPRAY    USE TWO SPRAY IN EACH NOSTRIL ONCE DAILY   TRIAMTERENE-HYDROCHLOROTHIAZIDE (MAXZIDE-25) 37.5-25 MG PER TABLET    Take 1 tablet by mouth daily.  Modified Medications   Modified Medication Previous Medication   TRAMADOL (ULTRAM) 50 MG TABLET traMADol (ULTRAM) 50 MG tablet      Take 1 tablet (50 mg total) by mouth every 8 (eight) hours as needed.    Take 1 tablet (50 mg total) by mouth every 8 (eight) hours as needed.  Discontinued Medications   No medications on file

## 2015-03-03 NOTE — Progress Notes (Signed)
Pre visit review using our clinic review tool, if applicable. No additional management support is needed unless otherwise documented below in the visit note. 

## 2015-03-04 ENCOUNTER — Ambulatory Visit: Payer: No Typology Code available for payment source | Admitting: Internal Medicine

## 2015-05-24 ENCOUNTER — Other Ambulatory Visit (HOSPITAL_COMMUNITY)
Admission: RE | Admit: 2015-05-24 | Discharge: 2015-05-24 | Disposition: A | Discharge status: Home / Self Care | Payer: No Typology Code available for payment source | Source: Ambulatory Visit | Attending: Internal Medicine | Admitting: Internal Medicine

## 2015-05-24 ENCOUNTER — Ambulatory Visit (INDEPENDENT_AMBULATORY_CARE_PROVIDER_SITE_OTHER): Payer: No Typology Code available for payment source | Admitting: Internal Medicine

## 2015-05-24 ENCOUNTER — Encounter: Payer: Self-pay | Admitting: Internal Medicine

## 2015-05-24 VITALS — BP 130/80 | HR 94 | Temp 98.4°F | Ht 65.0 in | Wt 220.0 lb

## 2015-05-24 DIAGNOSIS — Z0000 Encounter for general adult medical examination without abnormal findings: Secondary | ICD-10-CM | POA: Diagnosis not present

## 2015-05-24 DIAGNOSIS — Z23 Encounter for immunization: Secondary | ICD-10-CM

## 2015-05-24 DIAGNOSIS — Z01419 Encounter for gynecological examination (general) (routine) without abnormal findings: Secondary | ICD-10-CM | POA: Diagnosis present

## 2015-05-24 DIAGNOSIS — Z1151 Encounter for screening for human papillomavirus (HPV): Secondary | ICD-10-CM | POA: Insufficient documentation

## 2015-05-24 DIAGNOSIS — I10 Essential (primary) hypertension: Secondary | ICD-10-CM | POA: Diagnosis not present

## 2015-05-24 DIAGNOSIS — J4520 Mild intermittent asthma, uncomplicated: Secondary | ICD-10-CM

## 2015-05-24 DIAGNOSIS — Z1211 Encounter for screening for malignant neoplasm of colon: Secondary | ICD-10-CM

## 2015-05-24 NOTE — Progress Notes (Signed)
Pre visit review using our clinic review tool, if applicable. No additional management support is needed unless otherwise documented below in the visit note. 

## 2015-05-24 NOTE — Assessment & Plan Note (Signed)
Has been controlled

## 2015-05-24 NOTE — Progress Notes (Signed)
Subjective:    Patient ID: Madison Stout, female    DOB: Aug 04, 1950, 64 y.o.   MRN: [id number]  HPI Here for physical  Fatigue is better Not sure what was going on Did half the fluid pill in the hot weather and that might have helped---back to a full tab now  Did have all bottom teeth removed---"could have been the teeth" Now with full dentures  Shoulder is slightly better Some increase in ROM Still inconsistent with HEP Not really exercising in general  Retired--now just working 3 days per week. 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 albuterol (PROVENTIL HFA;VENTOLIN HFA) 108 (90 BASE) MCG/ACT inhaler Inhale 2 puffs into the lungs every 6 (six) hours as needed. 8.5 g 11  . aspirin 81 MG tablet Take 81 mg by mouth daily.      Marland Kitchen azelastine (ASTELIN) 0.1 % nasal spray Place 1 spray into both nostrils 2 (two) times daily. 30 mL 1  . fexofenadine (ALLEGRA) 180 MG tablet Take 180 mg by mouth daily.    . fluticasone (FLONASE) 50 MCG/ACT nasal spray USE TWO SPRAY IN EACH NOSTRIL ONCE DAILY 16 g 11  . traMADol (ULTRAM) 50 MG tablet Take 1 tablet (50 mg total) by mouth every 8 (eight) hours as needed. 50 tablet 2  . triamterene-hydrochlorothiazide (MAXZIDE-25) 37.5-25 MG per tablet Take 1 tablet by mouth daily. 90 tablet 3   No current facility-administered medications on file prior to visit.    Allergies  Allergen Reactions  . Clindamycin/Lincomycin     Chest felt tight, breathing off  . Ampicillin   . Penicillins     Past Medical History  Diagnosis Date  . Allergy   . Asthma   . Hypertension     Past Surgical History  Procedure Laterality Date  . Cesarean section    . Myomectomy  2005    removed  . Kidney stone surgery  12/04    Family History  Problem Relation Age of Onset  . Dementia Mother   . Hypertension Mother   . Diabetes Mother     . Cancer Mother     breast  . Cancer Other 2    breast cancer    Social History   Social History  . Marital Status: Married    Spouse Name: N/A  . Number of Children: 1  . Years of Education: N/A   Occupational History  . 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 Smoking status: Never Smoker   . Smokeless tobacco: Never Used  . Alcohol Use: No  . Drug Use: No  . Sexual Activity: Not on file   Other Topics Concern  . Not on file   Social History Narrative   Review of Systems  Constitutional: Negative for fatigue and unexpected weight change.       Wears seat belt  HENT: Positive for hearing loss. Negative for tinnitus.        Full dentures now  Eyes: Negative for visual disturbance.       No diplopia or unilateral vision loss  Respiratory: Negative for cough, chest tightness and shortness of breath.   Cardiovascular: Positive for palpitations. Negative for chest pain and leg swelling.  Gastrointestinal:       Occ stomachache  Takes stool softener every other day No heartburn  Endocrine: Negative for polydipsia and polyuria.  Genitourinary: Positive for urgency. Negative for dysuria, hematuria, difficulty urinating and dyspareunia.  Musculoskeletal: Positive for back pain and arthralgias. Negative for joint swelling.  Skin: Negative for rash.       No suspicious lesions  Allergic/Immunologic: Positive for environmental allergies. Negative for immunocompromised state.  Neurological: Positive for headaches. Negative for dizziness, syncope, weakness and light-headedness.  Hematological: Negative for adenopathy. Does not bruise/bleed easily.  Psychiatric/Behavioral: Negative for sleep disturbance and dysphoric mood. The patient is not nervous/anxious.        Objective:   Physical Exam  Constitutional: She is oriented to person, place, and time. She appears well-developed and well-nourished.  No distress.  HENT:  Head: Normocephalic and atraumatic.  Right Ear: External ear normal.  Left Ear: External ear normal.  Mouth/Throat: Oropharynx is clear and moist. No oropharyngeal exudate.  Eyes: Conjunctivae are normal. Pupils are equal, round, and reactive to light.  Neck: Normal range of motion. Neck supple. No thyromegaly present.  Cardiovascular: Normal rate, regular rhythm, normal heart sounds and intact distal pulses.  Exam reveals no gallop.   No murmur heard. Pulmonary/Chest: Effort normal and breath sounds normal. No respiratory distress. She has no wheezes. She has no rales.  Abdominal: Soft. There is no tenderness.  Genitourinary:  Normal introitus Cervix appears normal--pap done Bimanual attempted but couldn't really feel uterus  Musculoskeletal: She exhibits no edema or tenderness.  Lymphadenopathy:    She has no cervical adenopathy.  Neurological: She is alert and oriented to person, place, and time.  Skin: No rash noted. No erythema.  Psychiatric: She has a normal mood and affect. Her behavior is normal.          Assessment & Plan:

## 2015-05-24 NOTE — Addendum Note (Signed)
Addended by: Despina Hidden on: 05/24/2015 03:14 PM   Modules accepted: Orders

## 2015-05-24 NOTE — Patient Instructions (Signed)
Please set up your screening mammogram. 

## 2015-05-24 NOTE — Assessment & Plan Note (Signed)
BP Readings from Last 3 Encounters:  05/24/15 130/80  03/03/15 108/72  02/19/15 122/70   Good control No changes needed

## 2015-05-24 NOTE — Assessment & Plan Note (Signed)
Healthy but needs to work on fitness Pap done today She needs to set up mammogram Fecal immunoassay

## 2015-05-24 NOTE — Addendum Note (Signed)
Addended by: Despina Hidden on: 05/24/2015 03:10 PM   Modules accepted: Orders

## 2015-05-25 ENCOUNTER — Other Ambulatory Visit: Payer: Self-pay | Admitting: Internal Medicine

## 2015-05-25 DIAGNOSIS — Z1231 Encounter for screening mammogram for malignant neoplasm of breast: Secondary | ICD-10-CM

## 2015-05-25 LAB — CYTOLOGY - PAP

## 2015-06-01 ENCOUNTER — Other Ambulatory Visit (INDEPENDENT_AMBULATORY_CARE_PROVIDER_SITE_OTHER): Payer: No Typology Code available for payment source

## 2015-06-01 DIAGNOSIS — Z1211 Encounter for screening for malignant neoplasm of colon: Secondary | ICD-10-CM | POA: Diagnosis not present

## 2015-06-01 LAB — FECAL OCCULT BLOOD, IMMUNOCHEMICAL: FECAL OCCULT BLD: NEGATIVE

## 2015-06-09 ENCOUNTER — Ambulatory Visit
Admission: RE | Admit: 2015-06-09 | Discharge: 2015-06-09 | Disposition: A | Discharge status: Home / Self Care | Payer: No Typology Code available for payment source | Source: Ambulatory Visit | Attending: Internal Medicine | Admitting: Internal Medicine

## 2015-06-09 DIAGNOSIS — Z1231 Encounter for screening mammogram for malignant neoplasm of breast: Secondary | ICD-10-CM

## 2015-08-05 ENCOUNTER — Other Ambulatory Visit: Payer: Self-pay | Admitting: Internal Medicine

## 2015-11-05 ENCOUNTER — Other Ambulatory Visit: Payer: Self-pay | Admitting: Internal Medicine

## 2016-06-05 ENCOUNTER — Ambulatory Visit (INDEPENDENT_AMBULATORY_CARE_PROVIDER_SITE_OTHER): Payer: BLUE CROSS/BLUE SHIELD | Admitting: Internal Medicine

## 2016-06-05 ENCOUNTER — Encounter: Payer: Self-pay | Admitting: Internal Medicine

## 2016-06-05 VITALS — BP 122/82 | HR 73 | Temp 98.2°F | Ht 65.0 in | Wt 217.0 lb

## 2016-06-05 DIAGNOSIS — Z23 Encounter for immunization: Secondary | ICD-10-CM | POA: Diagnosis not present

## 2016-06-05 DIAGNOSIS — Z1211 Encounter for screening for malignant neoplasm of colon: Secondary | ICD-10-CM | POA: Diagnosis not present

## 2016-06-05 DIAGNOSIS — Z0000 Encounter for general adult medical examination without abnormal findings: Secondary | ICD-10-CM | POA: Diagnosis not present

## 2016-06-05 DIAGNOSIS — J4520 Mild intermittent asthma, uncomplicated: Secondary | ICD-10-CM | POA: Diagnosis not present

## 2016-06-05 DIAGNOSIS — I10 Essential (primary) hypertension: Secondary | ICD-10-CM

## 2016-06-05 LAB — COMPREHENSIVE METABOLIC PANEL
ALBUMIN: 4.3 g/dL (ref 3.5–5.2)
ALT: 12 U/L (ref 0–35)
AST: 15 U/L (ref 0–37)
Alkaline Phosphatase: 72 U/L (ref 39–117)
BUN: 17 mg/dL (ref 6–23)
CO2: 30 mEq/L (ref 19–32)
Calcium: 9.6 mg/dL (ref 8.4–10.5)
Chloride: 100 mEq/L (ref 96–112)
Creatinine, Ser: 1.09 mg/dL (ref 0.40–1.20)
GFR: 65.06 mL/min (ref >60.00)
Glucose, Bld: 81 mg/dL (ref 70–99)
POTASSIUM: 3.6 meq/L (ref 3.5–5.1)
Sodium: 137 mEq/L (ref 135–145)
Total Bilirubin: 0.5 mg/dL (ref 0.2–1.2)
Total Protein: 7.9 g/dL (ref 6.0–8.3)

## 2016-06-05 LAB — CBC WITH DIFFERENTIAL/PLATELET
Basophils Absolute: 0 10*3/uL (ref 0.0–0.1)
Basophils Relative: 0.5 % (ref 0.0–3.0)
EOS PCT: 2.1 % (ref 0.0–5.0)
Eosinophils Absolute: 0.1 10*3/uL (ref 0.0–0.7)
HEMATOCRIT: 38.7 % (ref 36.0–46.0)
HEMOGLOBIN: 12.8 g/dL (ref 12.0–15.0)
Lymphocytes Relative: 35.8 % (ref 12.0–46.0)
Lymphs Abs: 1.8 10*3/uL (ref 0.7–4.0)
MCHC: 32.9 g/dL (ref 30.0–36.0)
MCV: 77.6 fl — AB (ref 78.0–100.0)
MONO ABS: 0.3 10*3/uL (ref 0.1–1.0)
MONOS PCT: 6.6 % (ref 3.0–12.0)
Neutro Abs: 2.8 10*3/uL (ref 1.4–7.7)
Neutrophils Relative %: 55 % (ref 43.0–77.0)
Platelets: 204 10*3/uL (ref 150.0–400.0)
RBC: 4.99 Mil/uL (ref 3.87–5.11)
RDW: 14.8 % (ref 11.5–15.5)
WBC: 5 10*3/uL (ref 4.0–10.5)

## 2016-06-05 MED ORDER — ALBUTEROL SULFATE HFA 108 (90 BASE) MCG/ACT IN AERS: 2.0000 | INHALATION_SPRAY | Freq: Four times a day (QID) | RESPIRATORY_TRACT | 11 refills | Status: DC | PRN

## 2016-06-05 MED ORDER — TRAMADOL HCL 50 MG PO TABS: 50.0000 mg | ORAL_TABLET | Freq: Three times a day (TID) | ORAL | 0 refills | Status: DC | PRN

## 2016-06-05 NOTE — Assessment & Plan Note (Signed)
BP Readings from Last 3 Encounters:  06/05/16 122/82  05/24/15 130/80  03/03/15 108/72   Good control

## 2016-06-05 NOTE — Progress Notes (Signed)
Pre visit review using our clinic review tool, if applicable. No additional management support is needed unless otherwise documented below in the visit note. 

## 2016-06-05 NOTE — Addendum Note (Signed)
Addended by: Pilar Grammes on: 06/05/2016 12:40 PM   Modules accepted: Orders

## 2016-06-05 NOTE — Patient Instructions (Addendum)
Try adding senna to the stool softener to help the constipation.  DASH Eating Plan DASH stands for "Dietary Approaches to Stop Hypertension." The DASH eating plan is a healthy eating plan that has been shown to reduce high blood pressure (hypertension). Additional health benefits may include reducing the risk of type 2 diabetes mellitus, heart disease, and stroke. The DASH eating plan may also help with weight loss. What do I need to know about the DASH eating plan? For the DASH eating plan, you will follow these general guidelines:  Choose foods with less than 150 milligrams of sodium per serving (as listed on the food label).  Use salt-free seasonings or herbs instead of table salt or sea salt.  Check with your health care provider or pharmacist before using salt substitutes.  Eat lower-sodium products. These are often labeled as "low-sodium" or "no salt added."  Eat fresh foods. Avoid eating a lot of canned foods.  Eat more vegetables, fruits, and low-fat dairy products.  Choose whole grains. Look for the word "whole" as the first word in the ingredient list.  Choose fish and skinless chicken or [place] more often than red meat. Limit fish, poultry, and meat to 6 oz (170 g) each day.  Limit sweets, desserts, sugars, and sugary drinks.  Choose heart-healthy fats.  Eat more home-cooked food and less restaurant, buffet, and fast food.  Limit fried foods.  Do not fry foods. Cook foods using methods such as baking, boiling, grilling, and broiling instead.  When eating at a restaurant, ask that your food be prepared with less salt, or no salt if possible. What foods can I eat? Seek help from a dietitian for individual calorie needs. Grains  Whole grain or whole wheat bread. Brown rice. Whole grain or whole wheat pasta. Quinoa, bulgur, and whole grain cereals. Low-sodium cereals. Corn or whole wheat flour tortillas. Whole grain cornbread. Whole grain crackers. Low-sodium  crackers. Vegetables  Fresh or frozen vegetables (raw, steamed, roasted, or grilled). Low-sodium or reduced-sodium tomato and vegetable juices. Low-sodium or reduced-sodium tomato sauce and paste. Low-sodium or reduced-sodium canned vegetables. Fruits  All fresh, canned (in natural juice), or frozen fruits. Meat and Other Protein Products  Ground beef (85% or leaner), grass-fed beef, or beef trimmed of fat. Skinless chicken or [place]. Ground chicken or [place]. Pork trimmed of fat. All fish and seafood. Eggs. Dried beans, peas, or lentils. Unsalted nuts and seeds. Unsalted canned beans. Dairy  Low-fat dairy products, such as skim or 1% milk, 2% or reduced-fat cheeses, low-fat ricotta or cottage cheese, or plain low-fat yogurt. Low-sodium or reduced-sodium cheeses. Fats and Oils  Tub margarines without trans fats. Light or reduced-fat mayonnaise and salad dressings (reduced sodium). Avocado. Safflower, olive, or canola oils. Natural peanut or almond butter. Other  Unsalted popcorn and pretzels. The items listed above may not be a complete list of recommended foods or beverages. Contact your dietitian for more options.  What foods are not recommended? Grains  White bread. White pasta. White rice. Refined cornbread. Bagels and croissants. Crackers that contain trans fat. Vegetables  Creamed or fried vegetables. Vegetables in a cheese sauce. Regular canned vegetables. Regular canned tomato sauce and paste. Regular tomato and vegetable juices. Fruits  Canned fruit in light or heavy syrup. Fruit juice. Meat and Other Protein Products  Fatty cuts of meat. Ribs, chicken wings, bacon, sausage, bologna, salami, chitterlings, fatback, hot dogs, bratwurst, and packaged luncheon meats. Salted nuts and seeds. Canned beans with salt. Dairy  Whole or 2%  milk, cream, half-and-half, and cream cheese. Whole-fat or sweetened yogurt. Full-fat cheeses or blue cheese. Nondairy creamers and whipped toppings.  Processed cheese, cheese spreads, or cheese curds. Condiments  Onion and garlic salt, seasoned salt, table salt, and sea salt. Canned and packaged gravies. Worcestershire sauce. Tartar sauce. Barbecue sauce. Teriyaki sauce. Soy sauce, including reduced sodium. Steak sauce. Fish sauce. Oyster sauce. Cocktail sauce. Horseradish. Ketchup and mustard. Meat flavorings and tenderizers. Bouillon cubes. Hot sauce. Tabasco sauce. Marinades. Taco seasonings. Relishes. Fats and Oils  Butter, stick margarine, lard, shortening, ghee, and bacon fat. Coconut, palm kernel, or palm oils. Regular salad dressings. Other  Pickles and olives. Salted popcorn and pretzels. The items listed above may not be a complete list of foods and beverages to avoid. Contact your dietitian for more information.  Where can I find more information? National Heart, Lung, and Blood Institute: travelstabloid.com This information is not intended to replace advice given to you by your health care provider. Make sure you discuss any questions you have with your health care provider. Document Released: 06/15/2011 Document Revised: 12/02/2015 Document Reviewed: 04/30/2013 Elsevier Interactive Patient Education  2017 [employer].

## 2016-06-05 NOTE — Assessment & Plan Note (Signed)
Healthy Doesn't work on fitness--discussed Tdap today FIT Pap due 2019 (last one) mammo due next year

## 2016-06-05 NOTE — Progress Notes (Signed)
Subjective:    Patient ID: Madison Stout, female    DOB: [birth year], 65 y.o.   MRN: [id number]  HPI Here for physical  She is doing okay Still working part time Nothing new in [place]  Current Outpatient Prescriptions on File Prior to Visit  Medication Sig Dispense Refill  . albuterol (PROVENTIL HFA;VENTOLIN HFA) 108 (90 BASE) MCG/ACT inhaler Inhale 2 puffs into the lungs every 6 (six) hours as needed. 8.5 g 11  . aspirin 81 MG tablet Take 81 mg by mouth daily.      Marland Kitchen azelastine (ASTELIN) 0.1 % nasal spray Place 1 spray into both nostrils 2 (two) times daily. 30 mL 1  . fexofenadine (ALLEGRA) 180 MG tablet Take 180 mg by mouth daily.    . fluticasone (FLONASE) 50 MCG/ACT nasal spray USE TWO SPRAY(S) IN EACH NOSTRIL ONCE DAILY 16 g 11  . traMADol (ULTRAM) 50 MG tablet Take 1 tablet (50 mg total) by mouth every 8 (eight) hours as needed. 50 tablet 2  . triamterene-hydrochlorothiazide (MAXZIDE-25) 37.5-25 MG tablet TAKE 1 TABLET BY MOUTH DAILY 90 tablet 2   No current facility-administered medications on file prior to visit.     Allergies  Allergen Reactions  . Clindamycin/Lincomycin     Chest felt tight, breathing off  . Ampicillin   . Penicillins     Past Medical History:  Diagnosis Date  . Allergy   . Asthma   . Hypertension     Past Surgical History:  Procedure Laterality Date  . CESAREAN SECTION    . KIDNEY STONE SURGERY  12/04  . MYOMECTOMY  2005   removed    Family History  Problem Relation Age of Onset  . Dementia Mother   . Hypertension Mother   . Diabetes Mother   . Breast cancer Mother 101  . Cancer Mother     breast  . Cancer Other 6    breast cancer  . Breast cancer Other     Social History   Social History  . Marital status: Married    Spouse name: N/A  . Number of children: 1  . Years of education: N/A   Occupational History  . 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 Smoking status: Never Smoker  . Smokeless tobacco: Never Used  . Alcohol use No  . Drug use: No  . Sexual activity: Not on file   Other Topics Concern  . Not on file   Social History Narrative  . No narrative on file   Review of Systems  Constitutional: Negative for fatigue and unexpected weight change.       Not really exercising  HENT: Negative for tinnitus and trouble swallowing.        Full dentures Poor hearing--not ready for aides  Eyes: Negative for visual disturbance.       No diplopia or unilateral vision loss Trouble with night vision  Respiratory: Negative for cough, chest tightness and shortness of breath.        Hasn't used albuterol all year  Cardiovascular: Positive for chest pain. Negative for palpitations and leg swelling.       Very brief chest pain-- like for seconds. Usually with movement (like lifting).  No other symptoms  Gastrointestinal: Positive for constipation. Negative for blood in stool, nausea and vomiting.       No heartburn  Uses  stool softener  Endocrine: Negative for polydipsia and polyuria.  Genitourinary: Negative for dyspareunia.       Some urgency--rare incontinence if she goes too long   Musculoskeletal: Positive for back pain.       Hip and sciatic pain still Aleve at times Rare tramadol--with hard work day  Skin: Negative for rash.       No suspicious lesions  Allergic/Immunologic: Positive for environmental allergies. Negative for immunocompromised state.  Neurological: Negative for dizziness, syncope and light-headedness.       Rare headache  Hematological: Negative for adenopathy. Does not bruise/bleed easily.  Psychiatric/Behavioral: Negative for dysphoric mood and sleep disturbance. The patient is not nervous/anxious.        Objective:   Physical Exam  Constitutional: She is oriented to person, place, and time. She appears well-developed and well-nourished. No distress.  HENT:  Head: Normocephalic and  atraumatic.  Right Ear: External ear normal.  Left Ear: External ear normal.  Mouth/Throat: Oropharynx is clear and moist. No oropharyngeal exudate.  Eyes: Conjunctivae are normal. Pupils are equal, round, and reactive to light.  Neck: Normal range of motion. Neck supple. No thyromegaly present.  Cardiovascular: Normal rate, regular rhythm, normal heart sounds and intact distal pulses.  Exam reveals no gallop.   No murmur heard. Pulmonary/Chest: Effort normal and breath sounds normal. No respiratory distress. She has no wheezes. She has no rales.  Abdominal: Soft. There is no tenderness.  Musculoskeletal: She exhibits no edema or tenderness.  Neurological: She is alert and oriented to person, place, and time.  Skin: No rash noted. No erythema.  Psychiatric: She has a normal mood and affect. Her behavior is normal.          Assessment & Plan:

## 2016-06-05 NOTE — Assessment & Plan Note (Signed)
Doing well Not using the inhaler for a while

## 2016-06-16 ENCOUNTER — Other Ambulatory Visit (INDEPENDENT_AMBULATORY_CARE_PROVIDER_SITE_OTHER): Payer: BLUE CROSS/BLUE SHIELD

## 2016-06-16 DIAGNOSIS — Z1211 Encounter for screening for malignant neoplasm of colon: Secondary | ICD-10-CM

## 2016-06-16 LAB — FECAL OCCULT BLOOD, IMMUNOCHEMICAL: Fecal Occult Bld: NEGATIVE

## 2016-06-23 IMAGING — MG MM DIGITAL SCREENING BILAT W/ CAD
4 series · 4 of 4 positions shown · non-contrast
Comparison: Previous exam(s).

CLINICAL DATA: Screening.

EXAM:
DIGITAL SCREENING BILATERAL MAMMOGRAM WITH CAD

[L MLO]
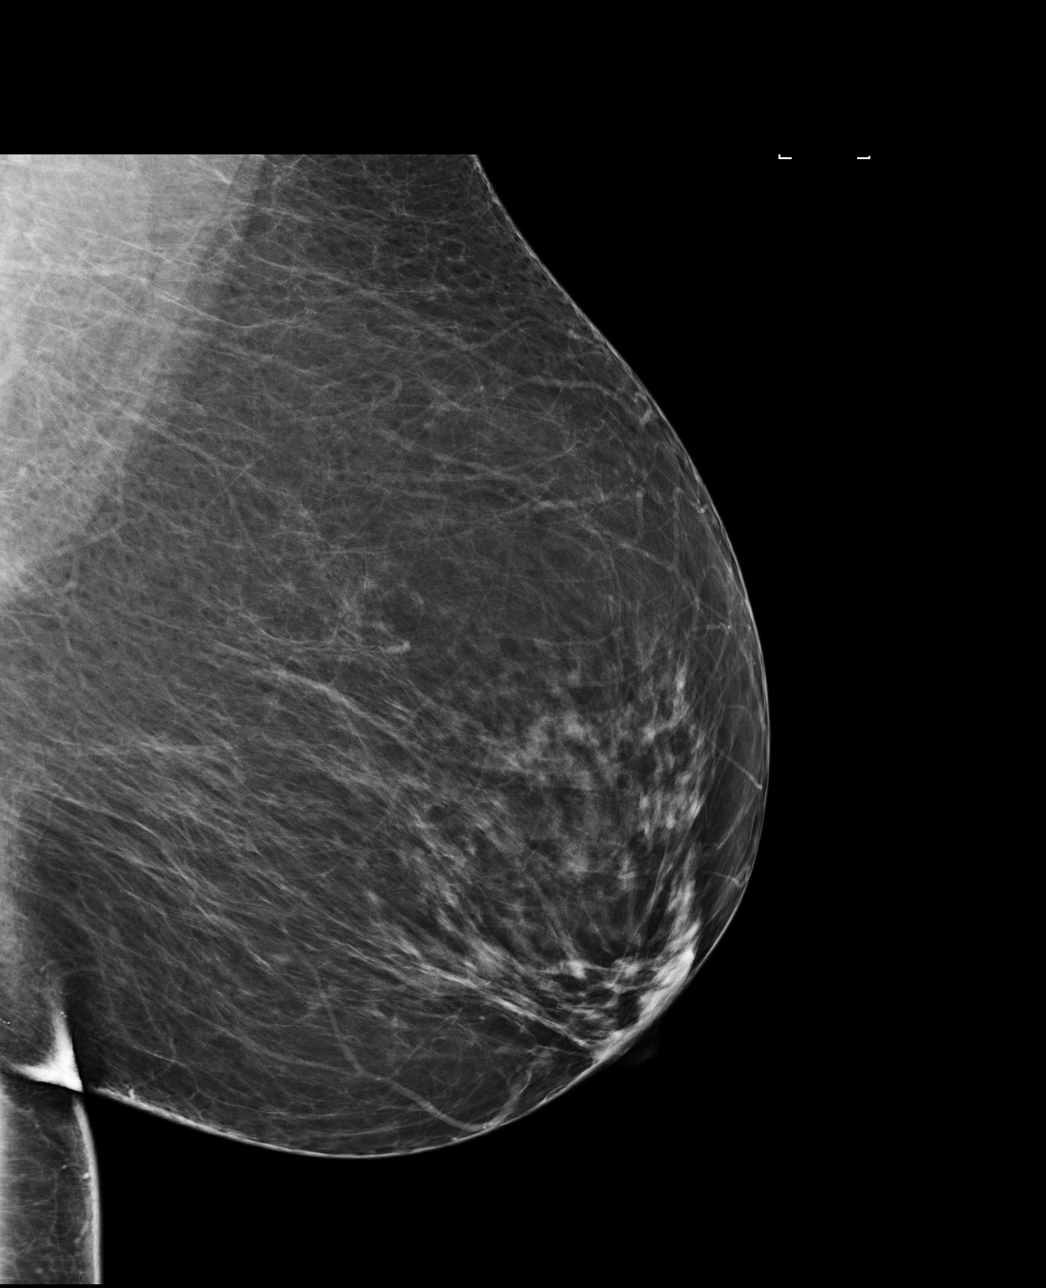

[L CC]
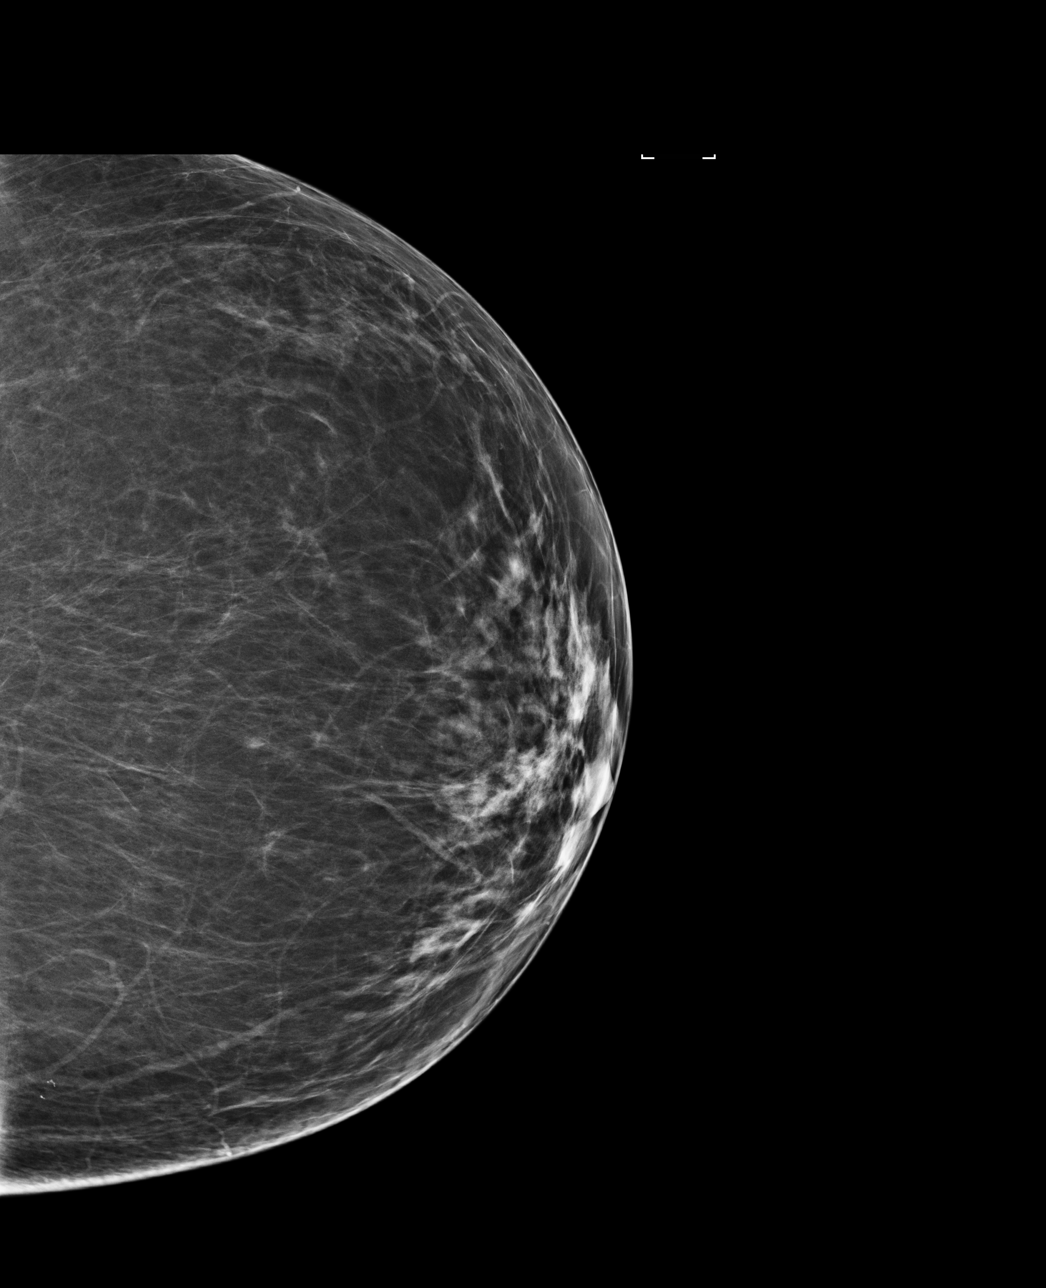

[R CC]
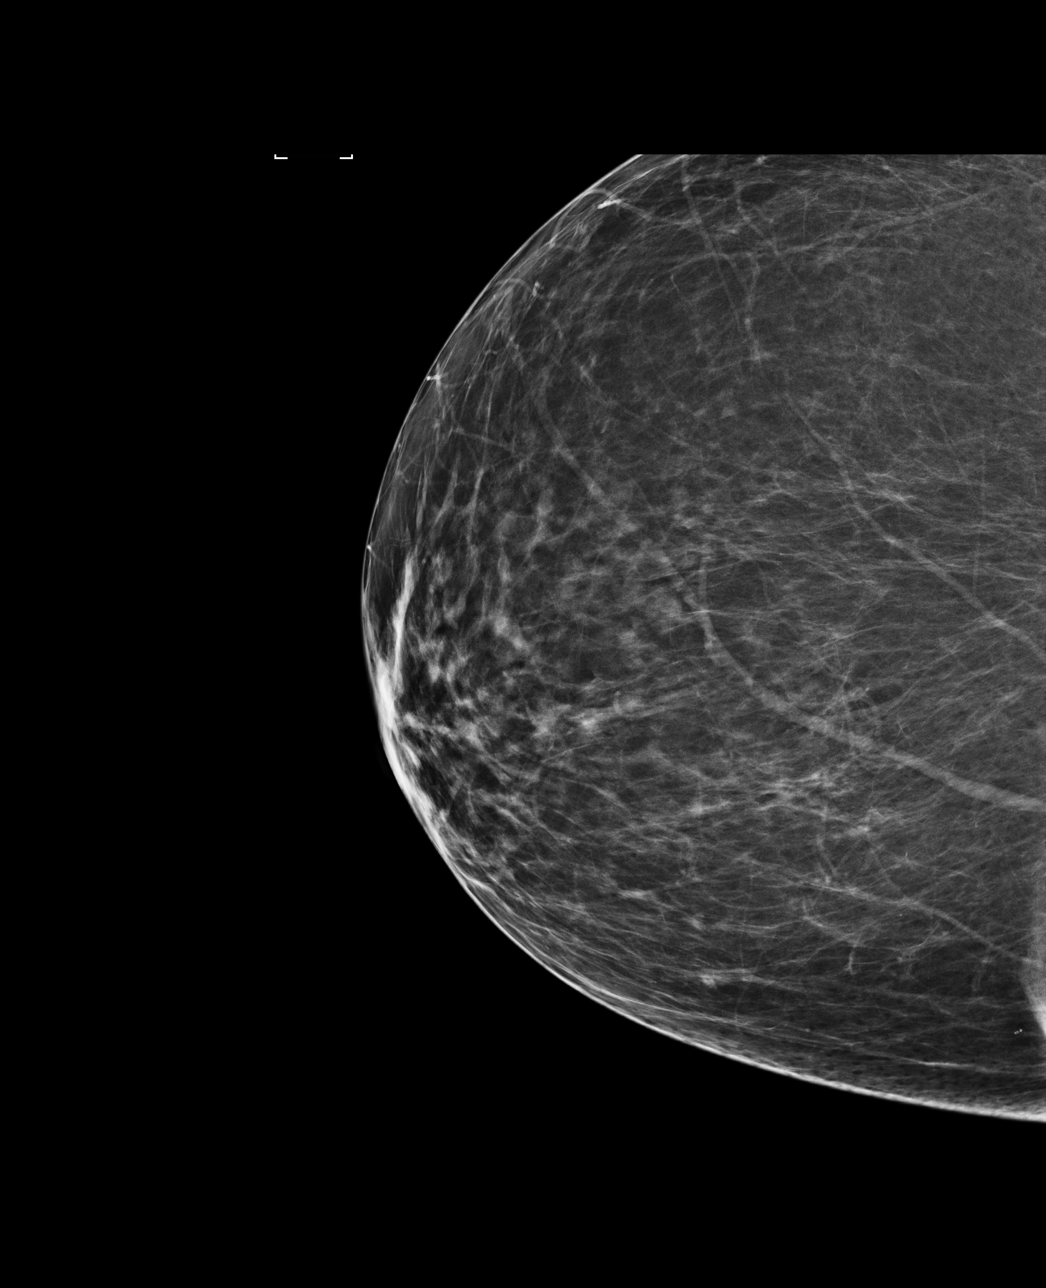

[R MLO]
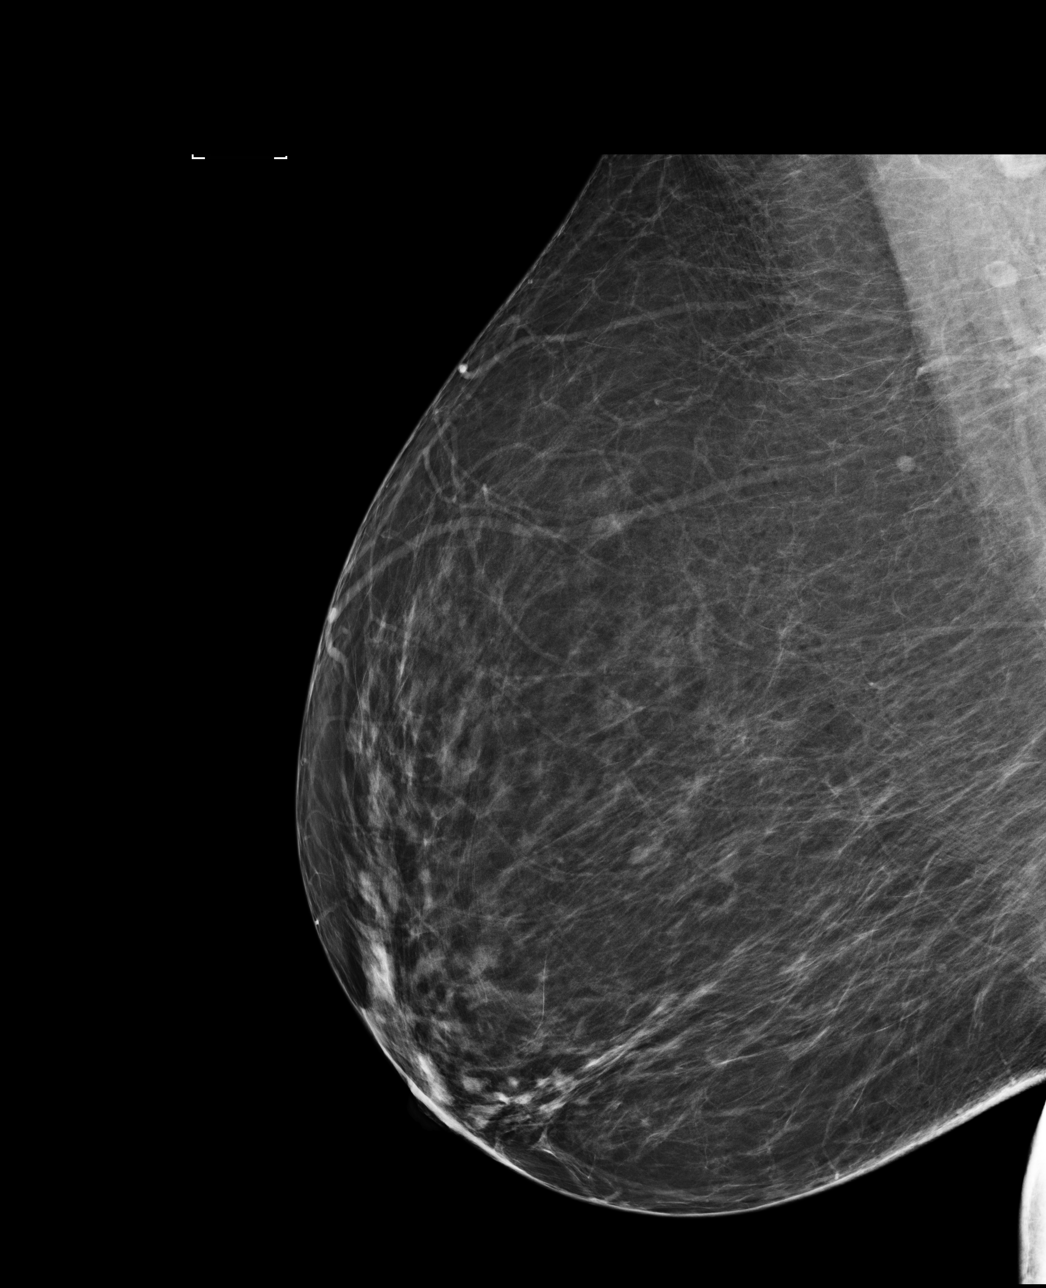

[4 of 4 positions shown; findings below may reference images not displayed]

ACR Breast Density Category c: The breast tissue is heterogeneously
dense, which may obscure small masses.
FINDINGS: There are no findings suspicious for malignancy. Images were
processed with CAD.
IMPRESSION: No mammographic evidence of malignancy. A result letter of this
screening mammogram will be mailed directly to the patient.

RECOMMENDATION:
Screening mammogram in one year. (Code:YJ-2-FEZ)

BI-RADS CATEGORY  1: Negative.

## 2016-08-06 ENCOUNTER — Other Ambulatory Visit: Payer: Self-pay | Admitting: Internal Medicine

## 2016-12-13 ENCOUNTER — Encounter: Payer: Self-pay | Admitting: Family Medicine

## 2016-12-13 ENCOUNTER — Ambulatory Visit (INDEPENDENT_AMBULATORY_CARE_PROVIDER_SITE_OTHER): Payer: BLUE CROSS/BLUE SHIELD | Admitting: Family Medicine

## 2016-12-13 DIAGNOSIS — H6122 Impacted cerumen, left ear: Secondary | ICD-10-CM

## 2016-12-13 DIAGNOSIS — H6120 Impacted cerumen, unspecified ear: Secondary | ICD-10-CM | POA: Insufficient documentation

## 2016-12-13 DIAGNOSIS — R42 Dizziness and giddiness: Secondary | ICD-10-CM | POA: Diagnosis not present

## 2016-12-13 MED ORDER — MECLIZINE HCL 25 MG PO TABS: 25.0000 mg | ORAL_TABLET | Freq: Three times a day (TID) | ORAL | 0 refills | Status: DC | PRN

## 2016-12-13 NOTE — Assessment & Plan Note (Signed)
?   Secondary to recent otitis and or cerumen impaction. Discussed supportive care as per AVS. eRx sent for meclizine. Call or return to clinic prn if these symptoms worsen or fail to improve as anticipated. The patient indicates understanding of these issues and agrees with the plan.

## 2016-12-13 NOTE — Progress Notes (Signed)
Subjective:   Patient ID: Madison Stout, female    DOB: 05-14-1951, 66 y.o.   MRN: [id number]  Madison Stout is a pleasant 66 y.o. year old female pt of Dr. Silvio Pate, new to me, who presents to clinic today with Vomiting (started yesterday, had some chills last night ); Nausea; and Dizziness  on 12/13/2016  HPI:  Two days ago, acute on onset of feeling like room is spinning, nausea and vomiting. Symptoms worse when she moves quickly or changes head positions.  Has never had anything like this before.  She was treated for an ear infection (left) [facility]  Current Outpatient Prescriptions on File Prior to Visit  Medication Sig Dispense Refill  . albuterol (PROVENTIL HFA;VENTOLIN HFA) 108 (90 Base) MCG/ACT inhaler Inhale 2 puffs into the lungs every 6 (six) hours as needed. 8.5 g 11  . aspirin 81 MG tablet Take 81 mg by mouth daily.      Marland Kitchen azelastine (ASTELIN) 0.1 % nasal spray Place 1 spray into both nostrils 2 (two) times daily. 30 mL 1  . fexofenadine (ALLEGRA) 180 MG tablet Take 180 mg by mouth daily.    . fluticasone (FLONASE) 50 MCG/ACT nasal spray USE TWO SPRAY(S) IN EACH NOSTRIL ONCE DAILY 16 g 11  . traMADol (ULTRAM) 50 MG tablet Take 1 tablet (50 mg total) by mouth every 8 (eight) hours as needed. 50 tablet 0  . triamterene-hydrochlorothiazide (MAXZIDE-25) 37.5-25 MG tablet TAKE 1 TABLET BY MOUTH DAILY 90 tablet 3   No current facility-administered medications on file prior to visit.     Allergies  Allergen Reactions  . Clindamycin/Lincomycin     Chest felt tight, breathing off  . Ampicillin   . Penicillins     Past Medical History:  Diagnosis Date  . Allergy   . Asthma   . Hypertension     Past Surgical History:  Procedure Laterality Date  . CESAREAN SECTION    . KIDNEY STONE SURGERY  12/04  . MYOMECTOMY  2005   removed    Family History  Problem Relation Age of Onset  . Dementia Mother   . Hypertension Mother   . Diabetes  Mother   . Breast cancer Mother 53  . Cancer Mother        breast  . Cancer Other 35       breast cancer  . Breast cancer Other     Social History   Social History  . Marital status: Married    Spouse name: N/A  . Number of children: 1  . Years of education: N/A   Occupational History  . 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 Smoking status: Never Smoker  . Smokeless tobacco: Never Used  . Alcohol use No  . Drug use: No  . Sexual activity: Not on file   Other Topics Concern  . Not on file   Social History Narrative  . No narrative on file   The PMH, PSH, Social History, Family History, Medications, and allergies have been reviewed in [facility], and have been updated if relevant.   Review of Systems  Constitutional: Negative.   HENT: Negative.   Eyes: Negative.   Gastrointestinal: Positive for nausea and vomiting. Negative for abdominal distention and abdominal pain.  Neurological: Positive for dizziness. Negative for tremors, seizures, syncope, facial asymmetry, speech  difficulty, weakness, light-headedness, numbness and headaches.  All other systems reviewed and are negative.      Objective:    BP 132/80   Pulse 83   Temp 98 F (36.7 C)   Ht 5\' 5"  (1.651 m)   Wt 218 lb 8 oz (99.1 kg)   SpO2 98%   BMI 36.36 kg/m    Physical Exam   General:  Well-developed,well-nourished,in no acute distress; alert,appropriate and cooperative throughout examination Head:  normocephalic and atraumatic.   Eyes:  vision grossly intact, PERRL Ears:  R ear normal and L ear normal externally, TMs clear bilaterally Nose:  no external deformity.   Left tm- impacted cerumen Mouth:  good dentition.   Neck:  No deformities, masses, or tenderness noted. Lungs:  Normal respiratory effort, chest expands symmetrically. Lungs are clear to auscultation, no crackles or wheezes. Heart:  Normal rate and regular  rhythm. S1 and S2 normal without gallop, murmur, click, rub or other extra sounds. Extremities:  No clubbing, cyanosis, edema, or deformity noted with normal full range of motion of all joints.   Neurologic:  alert & oriented X3 and gait normal.  dix hallpike pos left  Skin:  Intact without suspicious lesions or rashes Psych:  Cognition and judgment appear intact. Alert and cooperative with normal attention span and concentration. No apparent delusions, illusions, hallucinations       Assessment & Plan:   Vertigo  Impacted cerumen of left ear No Follow-up on file.

## 2016-12-13 NOTE — Assessment & Plan Note (Signed)
Ceruminosis is noted.  Wax is removed by syringing and manual debridement. Instructions for home care to prevent wax buildup are given.  

## 2016-12-13 NOTE — Patient Instructions (Signed)

## 2017-01-12 ENCOUNTER — Other Ambulatory Visit: Payer: Self-pay | Admitting: Family Medicine

## 2017-01-12 MED ORDER — MECLIZINE HCL 25 MG PO TABS: 25.0000 mg | ORAL_TABLET | Freq: Three times a day (TID) | ORAL | 1 refills | Status: DC | PRN

## 2017-01-12 NOTE — Telephone Encounter (Signed)
Last office visit 12/13/2016.  Last refilled 12/13/2016 for #30 with no refills.  Ok to refill?

## 2017-01-12 NOTE — Telephone Encounter (Signed)
Approved: #90 x 1 

## 2017-02-12 ENCOUNTER — Other Ambulatory Visit: Payer: Self-pay | Admitting: *Deleted

## 2017-02-12 MED ORDER — MECLIZINE HCL 25 MG PO TABS: 25.0000 mg | ORAL_TABLET | Freq: Three times a day (TID) | ORAL | 3 refills | Status: DC | PRN

## 2017-02-12 NOTE — Telephone Encounter (Signed)
Received faxed form from pharmacy requesting a 90 supply of Meclizine Last refill 01/12/17 #90/1 refill Last office visit 12/13/16 with Dr. Etta Grandchild

## 2017-02-12 NOTE — Telephone Encounter (Signed)
Rx sent electronically.  

## 2017-02-12 NOTE — Telephone Encounter (Signed)
Approved:#90 x 3 

## 2017-04-27 ENCOUNTER — Ambulatory Visit: Payer: BLUE CROSS/BLUE SHIELD

## 2017-06-06 ENCOUNTER — Encounter: Payer: BLUE CROSS/BLUE SHIELD | Admitting: Internal Medicine

## 2017-06-18 ENCOUNTER — Encounter: Payer: BLUE CROSS/BLUE SHIELD | Admitting: Internal Medicine

## 2017-07-04 ENCOUNTER — Encounter: Payer: Self-pay | Admitting: Internal Medicine

## 2017-07-04 ENCOUNTER — Ambulatory Visit (INDEPENDENT_AMBULATORY_CARE_PROVIDER_SITE_OTHER): Payer: BLUE CROSS/BLUE SHIELD | Admitting: Internal Medicine

## 2017-07-04 VITALS — BP 132/84 | HR 74 | Temp 97.7°F | Ht 65.0 in | Wt 220.0 lb

## 2017-07-04 DIAGNOSIS — Z0000 Encounter for general adult medical examination without abnormal findings: Secondary | ICD-10-CM | POA: Diagnosis not present

## 2017-07-04 DIAGNOSIS — Z1211 Encounter for screening for malignant neoplasm of colon: Secondary | ICD-10-CM | POA: Diagnosis not present

## 2017-07-04 DIAGNOSIS — J4520 Mild intermittent asthma, uncomplicated: Secondary | ICD-10-CM | POA: Diagnosis not present

## 2017-07-04 DIAGNOSIS — I10 Essential (primary) hypertension: Secondary | ICD-10-CM

## 2017-07-04 LAB — COMPREHENSIVE METABOLIC PANEL
ALBUMIN: 4.3 g/dL (ref 3.5–5.2)
ALT: 12 U/L (ref 0–35)
AST: 17 U/L (ref 0–37)
Alkaline Phosphatase: 71 U/L (ref 39–117)
BUN: 18 mg/dL (ref 6–23)
CALCIUM: 9.8 mg/dL (ref 8.4–10.5)
CHLORIDE: 99 meq/L (ref 96–112)
CO2: 29 mEq/L (ref 19–32)
Creatinine, Ser: 1.02 mg/dL (ref 0.40–1.20)
GFR: 70 mL/min (ref >60.00)
Glucose, Bld: 86 mg/dL (ref 70–99)
POTASSIUM: 3.6 meq/L (ref 3.5–5.1)
SODIUM: 137 meq/L (ref 135–145)
Total Bilirubin: 0.5 mg/dL (ref 0.2–1.2)
Total Protein: 8.1 g/dL (ref 6.0–8.3)

## 2017-07-04 LAB — CBC
HCT: 40.1 % (ref 36.0–46.0)
HEMOGLOBIN: 13 g/dL (ref 12.0–15.0)
MCHC: 32.6 g/dL (ref 30.0–36.0)
MCV: 78.7 fl (ref 78.0–100.0)
Platelets: 194 10*3/uL (ref 150.0–400.0)
RBC: 5.09 Mil/uL (ref 3.87–5.11)
RDW: 15 % (ref 11.5–15.5)
WBC: 4.3 10*3/uL (ref 4.0–10.5)

## 2017-07-04 NOTE — Assessment & Plan Note (Signed)
Healthy but needs to work on fitness Mammogram is due--asked her to set this up Will do FIT Pap would be due 2021---but will be over 67 DASH info

## 2017-07-04 NOTE — Progress Notes (Signed)
Subjective:    Patient ID: Madison Stout, female    DOB: 09-Aug-1950, 66 y.o.   MRN: [id number]  HPI Here for physical  Still working 10 hours a week Insurance through the exchange  Stress with husband's health issues---ESRD Now on hemodialysis He is fairly independent with this  No new concerns  Current Outpatient Medications on File Prior to Visit  Medication Sig Dispense Refill  . albuterol (PROVENTIL HFA;VENTOLIN HFA) 108 (90 Base) MCG/ACT inhaler Inhale 2 puffs into the lungs every 6 (six) hours as needed. 8.5 g 11  . aspirin 81 MG tablet Take 81 mg by mouth daily.      Marland Kitchen azelastine (ASTELIN) 0.1 % nasal spray Place 1 spray into both nostrils 2 (two) times daily. 30 mL 1  . fexofenadine (ALLEGRA) 180 MG tablet Take 180 mg by mouth daily.    . fluticasone (FLONASE) 50 MCG/ACT nasal spray USE TWO SPRAY(S) IN EACH NOSTRIL ONCE DAILY 16 g 11  . meclizine (ANTIVERT) 25 MG tablet Take 1 tablet (25 mg total) by mouth 3 (three) times daily as needed for dizziness. 90 tablet 3  . traMADol (ULTRAM) 50 MG tablet Take 1 tablet (50 mg total) by mouth every 8 (eight) hours as needed. 50 tablet 0  . triamterene-hydrochlorothiazide (MAXZIDE-25) 37.5-25 MG tablet TAKE 1 TABLET BY MOUTH DAILY 90 tablet 3   No current facility-administered medications on file prior to visit.     Allergies  Allergen Reactions  . Clindamycin/Lincomycin     Chest felt tight, breathing off  . Ampicillin   . Penicillins     Past Medical History:  Diagnosis Date  . Allergy   . Asthma   . Hypertension     Past Surgical History:  Procedure Laterality Date  . CESAREAN SECTION    . KIDNEY STONE SURGERY  12/04  . MYOMECTOMY  2005   removed    Family History  Problem Relation Age of Onset  . Dementia Mother   . Hypertension Mother   . Diabetes Mother   . Breast cancer Mother 25  . Cancer Mother        breast  . Cancer Other 38       breast cancer  . Breast cancer Other     Social History    Socioeconomic History  . Marital status: Married    Spouse name: Not on file  . Number of children: 1  . Years of education: Not on file  . Highest education level: Not on file  Social Needs  . Financial resource strain: Not on file  . Food insecurity - worry: Not on file  . Food insecurity - inability: Not on file  . Transportation needs - medical: Not on file  . Transportation needs - non-medical: Not on file  Occupational History  . 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 Smoking status: Never Smoker  . Smokeless tobacco: Never Used  Substance and Sexual Activity  . Alcohol use: No    Alcohol/week: 0.0 oz  . Drug use: No  . Sexual activity: Not on file  Other Topics Concern  . Not on file  Social History Narrative  . Not on file   Review of Systems  Constitutional:       No exercise Weight stable Wears seat belt  HENT: Positive for hearing loss. Negative for  dental problem, tinnitus and trouble swallowing.        Right ear seems to be worse. Discussed ENT if worsens. Keeps up with dentist  Eyes: Negative for visual disturbance.       No diplopia or unilateral vision  Respiratory: Negative for cough, shortness of breath and wheezing.        Chest pain in cold--better after warmed up  Cardiovascular: Negative for palpitations and leg swelling.  Gastrointestinal: Negative for blood in stool and nausea.       Occ stomach ache and diarrhea---then will be constipated. Chronic  Genitourinary: Positive for frequency. Negative for dyspareunia, dysuria and hematuria.  Musculoskeletal: Positive for arthralgias and back pain. Negative for joint swelling.       Sciatica and knee pain also  Skin: Negative for rash.       No suspicious lesions  Allergic/Immunologic: Positive for environmental allergies. Negative for immunocompromised state.       Meds somewhat  helpful  Neurological: Positive for headaches. Negative for syncope and light-headedness.       Has had some vertigo--meclizine helps  Hematological: Negative for adenopathy. Bruises/bleeds easily.  Psychiatric/Behavioral: Negative for dysphoric mood and sleep disturbance. The patient is not nervous/anxious.        Objective:   Physical Exam  Constitutional: She appears well-developed. No distress.  HENT:  Head: Normocephalic and atraumatic.  Right Ear: External ear normal.  Left Ear: External ear normal.  Mouth/Throat: Oropharynx is clear and moist. No oropharyngeal exudate.  Full dentures Cerumen bilaterally  Eyes: Conjunctivae are normal. Pupils are equal, round, and reactive to light.  Neck: No thyromegaly present.  Cardiovascular: Normal rate, regular rhythm, normal heart sounds and intact distal pulses. Exam reveals no gallop.  No murmur heard. Pulmonary/Chest: Effort normal and breath sounds normal. No respiratory distress. She has no wheezes. She has no rales.  Abdominal: Soft. She exhibits no distension. There is no tenderness. There is no rebound and no guarding.  Musculoskeletal: She exhibits no edema or tenderness.  Lymphadenopathy:    She has no cervical adenopathy.  Skin: No rash noted. No erythema.  Psychiatric: She has a normal mood and affect. Her behavior is normal.          Assessment & Plan:

## 2017-07-04 NOTE — Patient Instructions (Signed)
Please set up your screening mammogram.   DASH Eating Plan DASH stands for "Dietary Approaches to Stop Hypertension." The DASH eating plan is a healthy eating plan that has been shown to reduce high blood pressure (hypertension). It may also reduce your risk for type 2 diabetes, heart disease, and stroke. The DASH eating plan may also help with weight loss. What are tips for following this plan? General guidelines  Avoid eating more than 2,300 mg (milligrams) of salt (sodium) a day. If you have hypertension, you may need to reduce your sodium intake to 1,500 mg a day.  Limit alcohol intake to no more than 1 drink a day for nonpregnant women and 2 drinks a day for men. One drink equals 12 oz of beer, 5 oz of wine, or 1 oz of hard liquor.  Work with your health care provider to maintain a healthy body weight or to lose weight. Ask what an ideal weight is for you.  Get at least 30 minutes of exercise that causes your heart to beat faster (aerobic exercise) most days of the week. Activities may include walking, swimming, or biking.  Work with your health care provider or diet and nutrition specialist (dietitian) to adjust your eating plan to your individual calorie needs. Reading food labels  Check food labels for the amount of sodium per serving. Choose foods with less than 5 percent of the Daily Value of sodium. Generally, foods with less than 300 mg of sodium per serving fit into this eating plan.  To find whole grains, look for the word "whole" as the first word in the ingredient list. Shopping  Buy products labeled as "low-sodium" or "no salt added."  Buy fresh foods. Avoid canned foods and premade or frozen meals. Cooking  Avoid adding salt when cooking. Use salt-free seasonings or herbs instead of table salt or sea salt. Check with your health care provider or pharmacist before using salt substitutes.  Do not fry foods. Cook foods using healthy methods such as baking, boiling,  grilling, and broiling instead.  Cook with heart-healthy oils, such as olive, canola, soybean, or sunflower oil. Meal planning   Eat a balanced diet that includes: ? 5 or more servings of fruits and vegetables each day. At each meal, try to fill half of your plate with fruits and vegetables. ? Up to 6-8 servings of whole grains each day. ? Less than 6 oz of lean meat, poultry, or fish each day. A 3-oz serving of meat is about the same size as a deck of cards. One egg equals 1 oz. ? 2 servings of low-fat dairy each day. ? A serving of nuts, seeds, or beans 5 times each week. ? Heart-healthy fats. Healthy fats called Omega-3 fatty acids are found in foods such as flaxseeds and coldwater fish, like sardines, salmon, and mackerel.  Limit how much you eat of the following: ? Canned or prepackaged foods. ? Food that is high in trans fat, such as fried foods. ? Food that is high in saturated fat, such as fatty meat. ? Sweets, desserts, sugary drinks, and other foods with added sugar. ? Full-fat dairy products.  Do not salt foods before eating.  Try to eat at least 2 vegetarian meals each week.  Eat more home-cooked food and less restaurant, buffet, and fast food.  When eating at a restaurant, ask that your food be prepared with less salt or no salt, if possible. What foods are recommended? The items listed may not be a   complete list. Talk with your dietitian about what dietary choices are best for you. Grains Whole-grain or whole-wheat bread. Whole-grain or whole-wheat pasta. Brown rice. [place]. Bulgur. Whole-grain and low-sodium cereals. Pita bread. Low-fat, low-sodium crackers. Whole-wheat flour tortillas. Vegetables Fresh or frozen vegetables (raw, steamed, roasted, or grilled). Low-sodium or reduced-sodium tomato and vegetable juice. Low-sodium or reduced-sodium tomato sauce and tomato paste. Low-sodium or reduced-sodium canned vegetables. Fruits All fresh, dried, or frozen  fruit. Canned fruit in natural juice (without added sugar). Meat and other protein foods Skinless chicken or [place]. Ground chicken or [place]. Pork with fat trimmed off. Fish and seafood. Egg whites. Dried beans, peas, or lentils. Unsalted nuts, nut butters, and seeds. Unsalted canned beans. Lean cuts of beef with fat trimmed off. Low-sodium, lean deli meat. Dairy Low-fat (1%) or fat-free (skim) milk. Fat-free, low-fat, or reduced-fat cheeses. Nonfat, low-sodium ricotta or cottage cheese. Low-fat or nonfat yogurt. Low-fat, low-sodium cheese. Fats and oils Soft margarine without trans fats. Vegetable oil. Low-fat, reduced-fat, or light mayonnaise and salad dressings (reduced-sodium). Canola, safflower, olive, soybean, and sunflower oils. Avocado. Seasoning and other foods Herbs. Spices. Seasoning mixes without salt. Unsalted popcorn and pretzels. Fat-free sweets. What foods are not recommended? The items listed may not be a complete list. Talk with your dietitian about what dietary choices are best for you. Grains Baked goods made with fat, such as croissants, muffins, or some breads. Dry pasta or rice meal packs. Vegetables Creamed or fried vegetables. Vegetables in a cheese sauce. Regular canned vegetables (not low-sodium or reduced-sodium). Regular canned tomato sauce and paste (not low-sodium or reduced-sodium). Regular tomato and vegetable juice (not low-sodium or reduced-sodium). Angie Fava. Olives. Fruits Canned fruit in a light or heavy syrup. Fried fruit. Fruit in cream or butter sauce. Meat and other protein foods Fatty cuts of meat. Ribs. Fried meat. Berniece Salines. Sausage. Bologna and other processed lunch meats. Salami. Fatback. Hotdogs. Bratwurst. Salted nuts and seeds. Canned beans with added salt. Canned or smoked fish. Whole eggs or egg yolks. Chicken or [place] with skin. Dairy Whole or 2% milk, cream, and half-and-half. Whole or full-fat cream cheese. Whole-fat or sweetened yogurt. Full-fat  cheese. Nondairy creamers. Whipped toppings. Processed cheese and cheese spreads. Fats and oils Butter. Stick margarine. Lard. Shortening. Ghee. Bacon fat. Tropical oils, such as coconut, palm kernel, or palm oil. Seasoning and other foods Salted popcorn and pretzels. Onion salt, garlic salt, seasoned salt, table salt, and sea salt. Worcestershire sauce. Tartar sauce. Barbecue sauce. Teriyaki sauce. Soy sauce, including reduced-sodium. Steak sauce. Canned and packaged gravies. Fish sauce. Oyster sauce. Cocktail sauce. Horseradish that you find on the shelf. Ketchup. Mustard. Meat flavorings and tenderizers. Bouillon cubes. Hot sauce and Tabasco sauce. Premade or packaged marinades. Premade or packaged taco seasonings. Relishes. Regular salad dressings. Where to find more information:  National Heart, Lung, and [place]: https://wilson-eaton.com/  American Heart Association: www.heart.org Summary  The DASH eating plan is a healthy eating plan that has been shown to reduce high blood pressure (hypertension). It may also reduce your risk for type 2 diabetes, heart disease, and stroke.  With the DASH eating plan, you should limit salt (sodium) intake to 2,300 mg a day. If you have hypertension, you may need to reduce your sodium intake to 1,500 mg a day.  When on the DASH eating plan, aim to eat more fresh fruits and vegetables, whole grains, lean proteins, low-fat dairy, and heart-healthy fats.  Work with your health care provider or diet and nutrition specialist (dietitian)  to adjust your eating plan to your individual calorie needs. This information is not intended to replace advice given to you by your health care provider. Make sure you discuss any questions you have with your health care provider. Document Released: 06/15/2011 Document Revised: 06/19/2016 Document Reviewed: 06/19/2016 Elsevier Interactive Patient Education  2018 Elsevier Inc.  

## 2017-07-04 NOTE — Assessment & Plan Note (Signed)
BP Readings from Last 3 Encounters:  07/04/17 132/84  12/13/16 132/80  06/05/16 122/82   Good control Due for labs

## 2017-07-04 NOTE — Assessment & Plan Note (Signed)
Mild chest "pain" at times--but hasn't needed inhaler or other issues Only with cold

## 2017-07-13 ENCOUNTER — Other Ambulatory Visit: Payer: Self-pay | Admitting: Internal Medicine

## 2017-07-13 DIAGNOSIS — Z1231 Encounter for screening mammogram for malignant neoplasm of breast: Secondary | ICD-10-CM

## 2017-07-17 ENCOUNTER — Other Ambulatory Visit (INDEPENDENT_AMBULATORY_CARE_PROVIDER_SITE_OTHER): Payer: BLUE CROSS/BLUE SHIELD

## 2017-07-17 DIAGNOSIS — Z1211 Encounter for screening for malignant neoplasm of colon: Secondary | ICD-10-CM

## 2017-07-17 LAB — FECAL OCCULT BLOOD, IMMUNOCHEMICAL: FECAL OCCULT BLD: NEGATIVE

## 2017-07-28 ENCOUNTER — Other Ambulatory Visit: Payer: Self-pay | Admitting: Internal Medicine

## 2017-07-30 NOTE — Telephone Encounter (Signed)
Last filled 01-31-17 #50 Last OV 07-04-17 Next OV 07-05-18

## 2017-08-08 ENCOUNTER — Ambulatory Visit
Admission: RE | Admit: 2017-08-08 | Discharge: 2017-08-08 | Disposition: A | Discharge status: Home / Self Care | Payer: BLUE CROSS/BLUE SHIELD | Source: Ambulatory Visit | Attending: Internal Medicine | Admitting: Internal Medicine

## 2017-08-08 DIAGNOSIS — Z1231 Encounter for screening mammogram for malignant neoplasm of breast: Secondary | ICD-10-CM | POA: Diagnosis not present

## 2017-12-12 ENCOUNTER — Other Ambulatory Visit: Payer: Self-pay | Admitting: Internal Medicine

## 2017-12-12 MED ORDER — FLUTICASONE PROPIONATE 50 MCG/ACT NA SUSP: NASAL | 3 refills | Status: DC

## 2017-12-12 MED ORDER — TRIAMTERENE-HCTZ 37.5-25 MG PO TABS: 1.0000 | ORAL_TABLET | Freq: Every day | ORAL | 1 refills | Status: DC

## 2017-12-12 NOTE — Telephone Encounter (Signed)
Copied from [place] [id number]. Topic: Inquiry >> Dec 12, 2017 10:38 AM Pricilla Handler wrote: Reason for CRM: Belenda Cruise with [facility] [phone]) called requesting refills for the following medications for the patient: Fluticasone (FLONASE) 50 MCG/ACT nasal spray and Triamterene-hydrochlorothiazide (MAXZIDE-25) 37.5-25 MG tablet. Patient's preferred pharmacy is [facility], [place] [phone] (Phone)  [phone] (Fax).       Thank You!!!

## 2017-12-12 NOTE — Telephone Encounter (Signed)
Copied from [place] [phone]. Topic: Inquiry >> Dec 12, 2017 10:38 AM Pricilla Handler wrote: Reason for CRM: Belenda Cruise with [facility] [phone]) called requesting refills for the following medications for the patient: Fluticasone (FLONASE) 50 MCG/ACT nasal spray and Triamterene-hydrochlorothiazide (MAXZIDE-25) 37.5-25 MG tablet. Patient's preferred pharmacy is [facility], [place] [phone] (Phone)  [phone] (Fax).       Thank You!!!

## 2017-12-12 NOTE — Telephone Encounter (Signed)
Refill done per protocol.  

## 2018-02-14 ENCOUNTER — Encounter: Payer: Self-pay | Admitting: Internal Medicine

## 2018-02-14 ENCOUNTER — Ambulatory Visit (INDEPENDENT_AMBULATORY_CARE_PROVIDER_SITE_OTHER): Payer: Medicare HMO | Admitting: Internal Medicine

## 2018-02-14 VITALS — BP 132/82 | HR 78 | Temp 98.2°F | Wt 221.0 lb

## 2018-02-14 DIAGNOSIS — B9789 Other viral agents as the cause of diseases classified elsewhere: Secondary | ICD-10-CM

## 2018-02-14 DIAGNOSIS — J329 Chronic sinusitis, unspecified: Secondary | ICD-10-CM

## 2018-02-14 MED ORDER — PREDNISONE 10 MG PO TABS: ORAL_TABLET | ORAL | 0 refills | Status: DC

## 2018-02-14 NOTE — Patient Instructions (Signed)

## 2018-02-14 NOTE — Progress Notes (Signed)
HPI  Pt presents to the clinic today with c/o headache, facial pain and pressure, runny nose, ear fullness and intermittent dizziness. She reports this started 3 weeks ago. The headache she describes as pressure behind her eyes and in the forehead. She is blowing clear mucous out of her nose. She denies ear pain, drainage or decreased hearing. She denies fever, chills or body aches. She has a history of allergies and asthma. She is taking Astelin,  Allegra, Flonase and Albuterol as prescribed. She has also tried Sudafed. She has not had sick contacts. She does not smoke.   Review of Systems     Past Medical History:  Diagnosis Date  . Allergy   . Asthma   . Hypertension     Family History  Problem Relation Age of Onset  . Dementia Mother   . Hypertension Mother   . Diabetes Mother   . Breast cancer Mother 64  . Cancer Mother        breast  . Cancer Other 63       breast cancer  . Breast cancer Other     Social History   Socioeconomic History  . Marital status: Married    Spouse name: Not on file  . Number of children: 1  . Years of education: Not on file  . Highest education level: Not on file  Occupational History  . 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 Financial resource strain: Not on file  . Food insecurity:    Worry: Not on file    Inability: Not on file  . Transportation needs:    Medical: Not on file    Non-medical: Not on file  Tobacco Use  . Smoking status: Never Smoker  . Smokeless tobacco: Never Used  Substance and Sexual Activity  . Alcohol use: No    Alcohol/week: 0.0 standard drinks  . Drug use: No  . Sexual activity: Not on file  Lifestyle  . Physical activity:    Days per week: Not on file    Minutes per session: Not on file  . Stress: Not on file  Relationships  . Social connections:    Talks on phone: Not on file   Gets together: Not on file    Attends religious service: Not on file    Active member of club or organization: Not on file    Attends meetings of clubs or organizations: Not on file    Relationship status: Not on file  . Intimate partner violence:    Fear of current or ex partner: Not on file    Emotionally abused: Not on file    Physically abused: Not on file    Forced sexual activity: Not on file  Other Topics Concern  . Not on file  Social History Narrative  . Not on file    Allergies  Allergen Reactions  . Clindamycin/Lincomycin     Chest felt tight, breathing off  . Ampicillin   . Penicillins      Constitutional: Positive headache. Denies fatigue, fever or abrupt weight changes.  HEENT:  Positive facial pain, ear fullness, runny nose. Denies eye redness, ear pain, ringing in the ears, wax buildup, nasal congestion or bloody nose. Respiratory: Denies cough, difficulty breathing or shortness of breath.  Cardiovascular: Denies chest pain, chest tightness, palpitations or swelling in the hands or feet.  No other specific complaints in a complete review of systems (except as listed in HPI above).  Objective:   BP 132/82   Pulse 78   Temp 98.2 F (36.8 C) (Oral)   Wt 221 lb (100.2 kg)   SpO2 98%   BMI 36.78 kg/m   General: Appears her stated age, well developed, well nourished in NAD. HEENT: Head: normal shape and size, no sinus tenderness noted;  Ears: bilateral cerumen impation; Nose: mucosa boggy and moist, turbinates swollen; Throat/Mouth: + PND. Teeth present, mucosa erythematous and moist, no exudate noted, no lesions or ulcerations noted.  Neck:  No adenopathy noted.  Cardiovascular: Normal rate and rhythm. S1,S2 noted.  No murmur, rubs or gallops noted.  Pulmonary/Chest: Normal effort and positive vesicular breath sounds. No respiratory distress. No wheezes, rales or ronchi noted.       Assessment & Plan:   Viral Sinusitis  Can use a Neti Pot which can be  purchased from your local drug store. Continue Allegra and Astelin eRx for Pred Taper x 6 days  RTC as needed or if symptoms persist. Webb Silversmith, NP

## 2018-02-18 ENCOUNTER — Other Ambulatory Visit: Payer: Self-pay | Admitting: Internal Medicine

## 2018-02-25 ENCOUNTER — Telehealth: Payer: Self-pay | Admitting: Internal Medicine

## 2018-02-25 NOTE — Telephone Encounter (Signed)
Will discuss at upcoming appt.

## 2018-02-25 NOTE — Telephone Encounter (Signed)
Pt has appt with Madison Echevaria NP on 02/26/18 at 8:15.

## 2018-02-25 NOTE — Telephone Encounter (Signed)
Copied from [place] [phone]. Topic: Quick Communication - See Telephone Encounter >> Feb 25, 2018 10:00 AM Berneta Levins wrote: CRM for notification. See Telephone encounter for: 02/25/18. Pt states she has finished her Prednisone and she is still stuffed up, not hearing well, no taste.  Pt wants to know if there is something else that she can take.   Pt can be reached at [phone]

## 2018-02-26 ENCOUNTER — Encounter: Payer: Self-pay | Admitting: Internal Medicine

## 2018-02-26 ENCOUNTER — Ambulatory Visit (INDEPENDENT_AMBULATORY_CARE_PROVIDER_SITE_OTHER): Payer: Medicare HMO | Admitting: Internal Medicine

## 2018-02-26 VITALS — BP 130/82 | HR 80 | Temp 98.1°F | Wt 221.0 lb

## 2018-02-26 DIAGNOSIS — J0110 Acute frontal sinusitis, unspecified: Secondary | ICD-10-CM

## 2018-02-26 MED ORDER — DOXYCYCLINE HYCLATE 100 MG PO TABS: 100.0000 mg | ORAL_TABLET | Freq: Two times a day (BID) | ORAL | 0 refills | Status: DC

## 2018-02-26 NOTE — Progress Notes (Signed)
HPI  Pt presents to the clinic today with c/o persistent headache, nasal congestion, ear fullness and difficulty with taste. This has been going on for about 5 weeks. She was seen 8/8 for the same, diagnosed with a viral sinusitis. She was treated with Prednisone which she has finished and reports only minimal improvement. The headache is located in her forehead. She describes the pain as pressure. She denies dizziness. She is unable to blow anything out of her nose. She denies ear pain, but does have some muffled hearing. She denies fever, chills or body aches. She has tried Human resources officer, [facility] and Sudafed OTC with minimal relief. She has a history of allergies and asthma. She has not had sick contacts.  Review of Systems     Past Medical History:  Diagnosis Date  . Allergy   . Asthma   . Hypertension     Family History  Problem Relation Age of Onset  . Dementia Mother   . Hypertension Mother   . Diabetes Mother   . Breast cancer Mother 60  . Cancer Mother        breast  . Cancer Other 24       breast cancer  . Breast cancer Other     Social History   Socioeconomic History  . Marital status: Married    Spouse name: Not on file  . Number of children: 1  . Years of education: Not on file  . Highest education level: Not on file  Occupational History  . 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 Financial resource strain: Not on file  . Food insecurity:    Worry: Not on file    Inability: Not on file  . Transportation needs:    Medical: Not on file    Non-medical: Not on file  Tobacco Use  . Smoking status: Never Smoker  . Smokeless tobacco: Never Used  Substance and Sexual Activity  . Alcohol use: No    Alcohol/week: 0.0 standard drinks  . Drug use: No  . Sexual activity: Not on file  Lifestyle  . Physical activity:    Days per week: Not on file   Minutes per session: Not on file  . Stress: Not on file  Relationships  . Social connections:    Talks on phone: Not on file    Gets together: Not on file    Attends religious service: Not on file    Active member of club or organization: Not on file    Attends meetings of clubs or organizations: Not on file    Relationship status: Not on file  . Intimate partner violence:    Fear of current or ex partner: Not on file    Emotionally abused: Not on file    Physically abused: Not on file    Forced sexual activity: Not on file  Other Topics Concern  . Not on file  Social History Narrative  . Not on file    Allergies  Allergen Reactions  . Clindamycin/Lincomycin     Chest felt tight, breathing off  . Ampicillin   . Penicillins      Constitutional: Positive headache. Denies fatigue, fever or abrupt weight changes.  HEENT:  Positive nasal congestion and ear fullness. Denies eye redness, ear pain, ringing in the ears, wax buildup, runny nose or sore throat. Respiratory: Denies cough,  difficulty breathing or shortness of breath.  Cardiovascular: Denies chest pain, chest tightness, palpitations or swelling in the hands or feet.   No other specific complaints in a complete review of systems (except as listed in HPI above).  Objective:   BP 130/82   Pulse 80   Temp 98.1 F (36.7 C) (Oral)   Wt 221 lb (100.2 kg)   SpO2 97%   BMI 36.78 kg/m   General: Appears her stated age, in NAD. HEENT: Head: normal shape and size, frontal sinus tenderness noted; Ears: bilateral cerumen impaction; Nose: mucosa boggy and moist, turbinates swollen; Throat/Mouth: + PND. Teeth present, mucosa erythematous and moist, no exudate noted, no lesions or ulcerations noted.  Neck:  No adenopathy noted.  Pulmonary/Chest: Normal effort and positive vesicular breath sounds. No respiratory distress. No wheezes, rales or ronchi noted.       Assessment & Plan:   Acute Frontal Sinusitis:  Can use a Neti  Pot which can be purchased from your local drug store. Continue Flonase and Allegra eRx for Doxycycline BID for 10 days- discussed increase sun sensitivity If persists, consider CT sinus vs referral to ENT/Allergy  RTC as needed or if symptoms persist. Webb Silversmith, NP

## 2018-02-26 NOTE — Patient Instructions (Signed)

## 2018-03-12 ENCOUNTER — Telehealth: Payer: Self-pay | Admitting: Internal Medicine

## 2018-03-12 DIAGNOSIS — J329 Chronic sinusitis, unspecified: Secondary | ICD-10-CM

## 2018-03-12 NOTE — Telephone Encounter (Signed)
Pt had OV 02/26/18 with Webb Silversmith, NP. Copied from [place] [id number]. Topic: Referral - Request >> Mar 12, 2018  8:53 AM Oliver Pila B wrote: Reason for CRM: pt called to get a referral for ENT specialist Dr. Melissa Montane; contact pt if needed; pt addressed messaged to [facility]

## 2018-03-13 NOTE — Telephone Encounter (Signed)
Referral placed.

## 2018-03-13 NOTE — Addendum Note (Signed)
Addended by: Jearld Fenton on: 03/13/2018 11:46 AM   Modules accepted: Orders

## 2018-03-20 DIAGNOSIS — J329 Chronic sinusitis, unspecified: Secondary | ICD-10-CM | POA: Diagnosis not present

## 2018-05-01 ENCOUNTER — Other Ambulatory Visit: Payer: Self-pay | Admitting: Internal Medicine

## 2018-06-15 ENCOUNTER — Other Ambulatory Visit: Payer: Self-pay | Admitting: Internal Medicine

## 2018-06-17 NOTE — Telephone Encounter (Signed)
Last filled 07-30-17 #50 Last CPE 07-04-17 Next OV 07-05-18 [facility] [address]

## 2018-07-05 ENCOUNTER — Ambulatory Visit (INDEPENDENT_AMBULATORY_CARE_PROVIDER_SITE_OTHER): Payer: Medicare HMO | Admitting: Internal Medicine

## 2018-07-05 ENCOUNTER — Encounter: Payer: Self-pay | Admitting: Internal Medicine

## 2018-07-05 VITALS — BP 124/84 | HR 78 | Temp 97.9°F | Ht 64.5 in | Wt 222.0 lb

## 2018-07-05 DIAGNOSIS — J4520 Mild intermittent asthma, uncomplicated: Secondary | ICD-10-CM | POA: Diagnosis not present

## 2018-07-05 DIAGNOSIS — J329 Chronic sinusitis, unspecified: Secondary | ICD-10-CM

## 2018-07-05 DIAGNOSIS — I10 Essential (primary) hypertension: Secondary | ICD-10-CM

## 2018-07-05 DIAGNOSIS — Z23 Encounter for immunization: Secondary | ICD-10-CM

## 2018-07-05 DIAGNOSIS — Z0000 Encounter for general adult medical examination without abnormal findings: Secondary | ICD-10-CM | POA: Diagnosis not present

## 2018-07-05 DIAGNOSIS — Z7189 Other specified counseling: Secondary | ICD-10-CM

## 2018-07-05 DIAGNOSIS — E2839 Other primary ovarian failure: Secondary | ICD-10-CM

## 2018-07-05 DIAGNOSIS — Z1211 Encounter for screening for malignant neoplasm of colon: Secondary | ICD-10-CM

## 2018-07-05 DIAGNOSIS — M159 Polyosteoarthritis, unspecified: Secondary | ICD-10-CM | POA: Diagnosis not present

## 2018-07-05 LAB — COMPREHENSIVE METABOLIC PANEL
ALT: 12 U/L (ref 0–35)
AST: 17 U/L (ref 0–37)
Albumin: 4.4 g/dL (ref 3.5–5.2)
Alkaline Phosphatase: 73 U/L (ref 39–117)
BUN: 15 mg/dL (ref 6–23)
CO2: 28 mEq/L (ref 19–32)
CREATININE: 0.97 mg/dL (ref 0.40–1.20)
Calcium: 10 mg/dL (ref 8.4–10.5)
Chloride: 100 mEq/L (ref 96–112)
GFR: 73.95 mL/min (ref >60.00)
Glucose, Bld: 92 mg/dL (ref 70–99)
Potassium: 3.7 mEq/L (ref 3.5–5.1)
SODIUM: 139 meq/L (ref 135–145)
Total Bilirubin: 0.5 mg/dL (ref 0.2–1.2)
Total Protein: 8 g/dL (ref 6.0–8.3)

## 2018-07-05 LAB — CBC
HCT: 40 % (ref 36.0–46.0)
Hemoglobin: 13.3 g/dL (ref 12.0–15.0)
MCHC: 33.2 g/dL (ref 30.0–36.0)
MCV: 77.5 fl — ABNORMAL LOW (ref 78.0–100.0)
Platelets: 206 10*3/uL (ref 150.0–400.0)
RBC: 5.16 Mil/uL — ABNORMAL HIGH (ref 3.87–5.11)
RDW: 14.7 % (ref 11.5–15.5)
WBC: 4.5 10*3/uL (ref 4.0–10.5)

## 2018-07-05 LAB — T4, FREE: Free T4: 0.86 ng/dL (ref 0.60–1.60)

## 2018-07-05 MED ORDER — ALBUTEROL SULFATE HFA 108 (90 BASE) MCG/ACT IN AERS: 2.0000 | INHALATION_SPRAY | Freq: Four times a day (QID) | RESPIRATORY_TRACT | 11 refills | Status: DC | PRN

## 2018-07-05 MED ORDER — PREDNISONE 20 MG PO TABS: 40.0000 mg | ORAL_TABLET | Freq: Every day | ORAL | 0 refills | Status: DC

## 2018-07-05 MED ORDER — DOXYCYCLINE HYCLATE 100 MG PO TABS: 100.0000 mg | ORAL_TABLET | Freq: Two times a day (BID) | ORAL | 0 refills | Status: DC

## 2018-07-05 NOTE — Assessment & Plan Note (Signed)
Rare problems Will refill the albuterol

## 2018-07-05 NOTE — Progress Notes (Signed)
Hearing Screening   Method: Audiometry   125Hz  250Hz  500Hz  1000Hz  2000Hz  3000Hz  4000Hz  6000Hz  8000Hz   Right ear:   20 20 20   40    Left ear:   40 40 40  40      Visual Acuity Screening   Right eye Left eye Both eyes  Without correction:     With correction: 20/25 20/25 20/20

## 2018-07-05 NOTE — Patient Instructions (Signed)
DASH Eating Plan  DASH stands for "Dietary Approaches to Stop Hypertension." The DASH eating plan is a healthy eating plan that has been shown to reduce high blood pressure (hypertension). It may also reduce your risk for type 2 diabetes, heart disease, and stroke. The DASH eating plan may also help with weight loss.  What are tips for following this plan?    General guidelines   Avoid eating more than 2,300 mg (milligrams) of salt (sodium) a day. If you have hypertension, you may need to reduce your sodium intake to 1,500 mg a day.   Limit alcohol intake to no more than 1 drink a day for nonpregnant women and 2 drinks a day for men. One drink equals 12 oz of beer, 5 oz of wine, or 1 oz of hard liquor.   Work with your health care provider to maintain a healthy body weight or to lose weight. Ask what an ideal weight is for you.   Get at least 30 minutes of exercise that causes your heart to beat faster (aerobic exercise) most days of the week. Activities may include walking, swimming, or biking.   Work with your health care provider or diet and nutrition specialist (dietitian) to adjust your eating plan to your individual calorie needs.  Reading food labels     Check food labels for the amount of sodium per serving. Choose foods with less than 5 percent of the Daily Value of sodium. Generally, foods with less than 300 mg of sodium per serving fit into this eating plan.   To find whole grains, look for the word "whole" as the first word in the ingredient list.  Shopping   Buy products labeled as "low-sodium" or "no salt added."   Buy fresh foods. Avoid canned foods and premade or frozen meals.  Cooking   Avoid adding salt when cooking. Use salt-free seasonings or herbs instead of table salt or sea salt. Check with your health care provider or pharmacist before using salt substitutes.   Do not fry foods. Cook foods using healthy methods such as baking, boiling, grilling, and broiling instead.   Cook with  heart-healthy oils, such as olive, canola, soybean, or sunflower oil.  Meal planning   Eat a balanced diet that includes:  ? 5 or more servings of fruits and vegetables each day. At each meal, try to fill half of your plate with fruits and vegetables.  ? Up to 6-8 servings of whole grains each day.  ? Less than 6 oz of lean meat, poultry, or fish each day. A 3-oz serving of meat is about the same size as a deck of cards. One egg equals 1 oz.  ? 2 servings of low-fat dairy each day.  ? A serving of nuts, seeds, or beans 5 times each week.  ? Heart-healthy fats. Healthy fats called Omega-3 fatty acids are found in foods such as flaxseeds and coldwater fish, like sardines, salmon, and mackerel.   Limit how much you eat of the following:  ? Canned or prepackaged foods.  ? Food that is high in trans fat, such as fried foods.  ? Food that is high in saturated fat, such as fatty meat.  ? Sweets, desserts, sugary drinks, and other foods with added sugar.  ? Full-fat dairy products.   Do not salt foods before eating.   Try to eat at least 2 vegetarian meals each week.   Eat more home-cooked food and less restaurant, buffet, and fast food.     When eating at a restaurant, ask that your food be prepared with less salt or no salt, if possible.  What foods are recommended?  The items listed may not be a complete list. Talk with your dietitian about what dietary choices are best for you.  Grains  Whole-grain or whole-wheat bread. Whole-grain or whole-wheat pasta. Brown rice. Oatmeal. Quinoa. Bulgur. Whole-grain and low-sodium cereals. Pita bread. Low-fat, low-sodium crackers. Whole-wheat flour tortillas.  Vegetables  Fresh or frozen vegetables (raw, steamed, roasted, or grilled). Low-sodium or reduced-sodium tomato and vegetable juice. Low-sodium or reduced-sodium tomato sauce and tomato paste. Low-sodium or reduced-sodium canned vegetables.  Fruits  All fresh, dried, or frozen fruit. Canned fruit in natural juice (without  added sugar).  Meat and other protein foods  Skinless chicken or turkey. Ground chicken or turkey. Pork with fat trimmed off. Fish and seafood. Egg whites. Dried beans, peas, or lentils. Unsalted nuts, nut butters, and seeds. Unsalted canned beans. Lean cuts of beef with fat trimmed off. Low-sodium, lean deli meat.  Dairy  Low-fat (1%) or fat-free (skim) milk. Fat-free, low-fat, or reduced-fat cheeses. Nonfat, low-sodium ricotta or cottage cheese. Low-fat or nonfat yogurt. Low-fat, low-sodium cheese.  Fats and oils  Soft margarine without trans fats. Vegetable oil. Low-fat, reduced-fat, or light mayonnaise and salad dressings (reduced-sodium). Canola, safflower, olive, soybean, and sunflower oils. Avocado.  Seasoning and other foods  Herbs. Spices. Seasoning mixes without salt. Unsalted popcorn and pretzels. Fat-free sweets.  What foods are not recommended?  The items listed may not be a complete list. Talk with your dietitian about what dietary choices are best for you.  Grains  Baked goods made with fat, such as croissants, muffins, or some breads. Dry pasta or rice meal packs.  Vegetables  Creamed or fried vegetables. Vegetables in a cheese sauce. Regular canned vegetables (not low-sodium or reduced-sodium). Regular canned tomato sauce and paste (not low-sodium or reduced-sodium). Regular tomato and vegetable juice (not low-sodium or reduced-sodium). Pickles. Olives.  Fruits  Canned fruit in a light or heavy syrup. Fried fruit. Fruit in cream or butter sauce.  Meat and other protein foods  Fatty cuts of meat. Ribs. Fried meat. Bacon. Sausage. Bologna and other processed lunch meats. Salami. Fatback. Hotdogs. Bratwurst. Salted nuts and seeds. Canned beans with added salt. Canned or smoked fish. Whole eggs or egg yolks. Chicken or turkey with skin.  Dairy  Whole or 2% milk, cream, and half-and-half. Whole or full-fat cream cheese. Whole-fat or sweetened yogurt. Full-fat cheese. Nondairy creamers. Whipped toppings.  Processed cheese and cheese spreads.  Fats and oils  Butter. Stick margarine. Lard. Shortening. Ghee. Bacon fat. Tropical oils, such as coconut, palm kernel, or palm oil.  Seasoning and other foods  Salted popcorn and pretzels. Onion salt, garlic salt, seasoned salt, table salt, and sea salt. Worcestershire sauce. Tartar sauce. Barbecue sauce. Teriyaki sauce. Soy sauce, including reduced-sodium. Steak sauce. Canned and packaged gravies. Fish sauce. Oyster sauce. Cocktail sauce. Horseradish that you find on the shelf. Ketchup. Mustard. Meat flavorings and tenderizers. Bouillon cubes. Hot sauce and Tabasco sauce. Premade or packaged marinades. Premade or packaged taco seasonings. Relishes. Regular salad dressings.  Where to find more information:   National Heart, Lung, and Blood Institute: www.nhlbi.nih.gov   American Heart Association: www.heart.org  Summary   The DASH eating plan is a healthy eating plan that has been shown to reduce high blood pressure (hypertension). It may also reduce your risk for type 2 diabetes, heart disease, and stroke.   With the   DASH eating plan, you should limit salt (sodium) intake to 2,300 mg a day. If you have hypertension, you may need to reduce your sodium intake to 1,500 mg a day.   When on the DASH eating plan, aim to eat more fresh fruits and vegetables, whole grains, lean proteins, low-fat dairy, and heart-healthy fats.   Work with your health care provider or diet and nutrition specialist (dietitian) to adjust your eating plan to your individual calorie needs.  This information is not intended to replace advice given to you by your health care provider. Make sure you discuss any questions you have with your health care provider.  Document Released: 06/15/2011 Document Revised: 06/19/2016 Document Reviewed: 06/19/2016  Elsevier Interactive Patient Education  2019 Elsevier Inc[employer]

## 2018-07-05 NOTE — Assessment & Plan Note (Signed)
BP Readings from Last 3 Encounters:  07/05/18 124/84  02/26/18 130/82  02/14/18 132/82   Good control Due for labs

## 2018-07-05 NOTE — Assessment & Plan Note (Signed)
I have personally reviewed the Medicare Annual Wellness questionnaire and have noted 1. The patient's medical and social history 2. Their use of alcohol, tobacco or illicit drugs 3. Their current medications and supplements 4. The patient's functional ability including ADL's, fall risks, home safety risks and hearing or visual             impairment. 5. Diet and physical activities 6. Evidence for depression or mood disorders  The patients weight, height, BMI and visual acuity have been recorded in the chart I have made referrals, counseling and provided education to the patient based review of the above and I have provided the pt with a written personalized care plan for preventive services.  I have provided you with a copy of your personalized plan for preventive services. Please take the time to review along with your updated medication list.  Will give pneumovax Yearly flu vaccine FIT Will get mammogram again soon Consider one last pap in 2021 Discussed fitness

## 2018-07-05 NOTE — Assessment & Plan Note (Signed)
See social history 

## 2018-07-05 NOTE — Assessment & Plan Note (Signed)
BMI 36 with arthritis, HTN, asthma Discussed DASH eating and increased exercise Goal is 10# weight loss per year

## 2018-07-05 NOTE — Assessment & Plan Note (Signed)
Uses the tramadol rarely 

## 2018-07-05 NOTE — Addendum Note (Signed)
Addended by: Pilar Grammes on: 07/05/2018 10:52 AM   Modules accepted: Orders

## 2018-07-05 NOTE — Progress Notes (Signed)
Subjective:    Patient ID: Madison Stout, female    DOB: 03/04/1951, 67 y.o.   MRN: [id number]  HPI Here for Welcome to Medicare visit and follow up of chronic health conditions Reviewed form and advanced directives Reviewed other doctors No alcohol or tobacco Tries to walk regularly No falls No depression or anhedonia Independent with instrumental ADLs Mild memory issues--mostly just recall Vision is okay Hearing isn't great---not sure about hearing aides  Having sinus symptoms again Lots of pain especially on left side Has had recurrent problems since summer Prednisone helped the pain Did see ENT--[facility] Has had reduced hearing, taste and smell Daily allergy meds and nasal spray  No apparent problems with BP Gets occasional chest pain-- upper chest  Goes away on its own No SOB No nausea or diaphoresis Not clearly related to meals Discussed warning signs of CAD Gets some dizzy feelings when she gets up from bending--with the sinus issues  Some chronic joint pain Back, knees, etc Tramadol helps--- only uses once a week (when has to do heavy lifting)  Asthma has been quiet Sensitive to perfumes Rarely uses the albuterol No regular wheezing or cough  Current Outpatient Medications on File Prior to Visit  Medication Sig Dispense Refill  . albuterol (PROVENTIL HFA;VENTOLIN HFA) 108 (90 Base) MCG/ACT inhaler Inhale 2 puffs into the lungs every 6 (six) hours as needed. 8.5 g 11  . aspirin 81 MG tablet Take 81 mg by mouth daily.      . fexofenadine (ALLEGRA) 180 MG tablet Take 180 mg by mouth daily.    . fluticasone (FLONASE) 50 MCG/ACT nasal spray USE TWO SPRAYS IN EACH NOSTRIL ONCE DAILY 48 g 3  . meclizine (ANTIVERT) 25 MG tablet Take 1 tablet (25 mg total) by mouth 3 (three) times daily as needed for dizziness. 90 tablet 3  . traMADol (ULTRAM) 50 MG tablet TAKE 1 TABLET BY MOUTH EVERY 8 HOURS AS NEEDED 50 tablet 0  . triamterene-hydrochlorothiazide (MAXZIDE-25)  37.5-25 MG tablet TAKE 1 TABLET EVERY DAY 90 tablet 0   No current facility-administered medications on file prior to visit.     Allergies  Allergen Reactions  . Clindamycin/Lincomycin     Chest felt tight, breathing off  . Ampicillin   . Penicillins     Past Medical History:  Diagnosis Date  . Allergy   . Asthma   . Hypertension     Past Surgical History:  Procedure Laterality Date  . CESAREAN SECTION    . KIDNEY STONE SURGERY  12/04  . MYOMECTOMY  2005   removed    Family History  Problem Relation Age of Onset  . Dementia Mother   . Hypertension Mother   . Diabetes Mother   . Breast cancer Mother 64  . Cancer Mother        breast  . Cancer Other 10       breast cancer  . Breast cancer Other     Social History   Socioeconomic History  . Marital status: Married    Spouse name: Not on file  . Number of children: 1  . Years of education: Not on file  . Highest education level: Not on file  Occupational History  . 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 Financial resource strain: Not on file  . Food insecurity:  Worry: Not on file    Inability: Not on file  . Transportation needs:    Medical: Not on file    Non-medical: Not on file  Tobacco Use  . Smoking status: Never Smoker  . Smokeless tobacco: Never Used  Substance and Sexual Activity  . Alcohol use: No    Alcohol/week: 0.0 standard drinks  . Drug use: No  . Sexual activity: Not on file  Lifestyle  . Physical activity:    Days per week: Not on file    Minutes per session: Not on file  . Stress: Not on file  Relationships  . Social connections:    Talks on phone: Not on file    Gets together: Not on file    Attends religious service: Not on file    Active member of club or organization: Not on file    Attends meetings of clubs or organizations: Not on file     Relationship status: Not on file  . Intimate partner violence:    Fear of current or ex partner: Not on file    Emotionally abused: Not on file    Physically abused: Not on file    Forced sexual activity: Not on file  Other Topics Concern  . Not on file  Social History Narrative  . Not on file   Review of Systems  Appetite is fine---doesn't eat as much Weight is stable--frustrating  Sleeps well Wears seat belt Full dentures--no problems No skin problems Bowels are fine. Did have some blood on toilet paper earlier this week--not typical Some urinary urgency but no incontinence. No hematuria or dysuria No heartburn or dysphagia      Objective:   Physical Exam  Constitutional: She is oriented to person, place, and time. She appears well-developed. No distress.  HENT:  Mouth/Throat: Oropharynx is clear and moist. No oropharyngeal exudate.  Neck: No thyromegaly present.  Cardiovascular: Normal rate, regular rhythm, normal heart sounds and intact distal pulses. Exam reveals no gallop.  No murmur heard. Respiratory: Effort normal and breath sounds normal. No respiratory distress. She has no wheezes. She has no rales.  GI: Soft. There is no abdominal tenderness.  Musculoskeletal:        General: No tenderness or edema.  Lymphadenopathy:    She has no cervical adenopathy.  Neurological: She is alert and oriented to person, place, and time.  President--- "Kyung Rudd--- Obama" [id number] Recall 3/3  Skin: No rash noted. No erythema.  Psychiatric: She has a normal mood and affect. Her behavior is normal.           Assessment & Plan:

## 2018-07-05 NOTE — Assessment & Plan Note (Signed)
Some left maxillary tenderness Ongoing issues Will try the doxy and prednisone again

## 2018-07-08 ENCOUNTER — Other Ambulatory Visit: Payer: Self-pay | Admitting: Internal Medicine

## 2018-07-15 ENCOUNTER — Other Ambulatory Visit: Payer: Self-pay | Admitting: Internal Medicine

## 2018-07-15 DIAGNOSIS — Z1231 Encounter for screening mammogram for malignant neoplasm of breast: Secondary | ICD-10-CM

## 2018-07-18 ENCOUNTER — Other Ambulatory Visit (INDEPENDENT_AMBULATORY_CARE_PROVIDER_SITE_OTHER): Payer: Medicare HMO

## 2018-07-18 DIAGNOSIS — Z1211 Encounter for screening for malignant neoplasm of colon: Secondary | ICD-10-CM

## 2018-07-18 LAB — FECAL OCCULT BLOOD, IMMUNOCHEMICAL: Fecal Occult Bld: NEGATIVE

## 2018-07-25 ENCOUNTER — Telehealth: Payer: Self-pay | Admitting: *Deleted

## 2018-07-25 MED ORDER — LEVOFLOXACIN 500 MG PO TABS: 500.0000 mg | ORAL_TABLET | Freq: Every day | ORAL | 0 refills | Status: DC

## 2018-07-25 NOTE — Telephone Encounter (Signed)
Pt wants to know if she is going to get med for her ear. Pt notified as instructed; pt voiced understanding and I will send in Levaquin 500 mg daily x 7 to [facility]. Pt was appreciative.

## 2018-07-25 NOTE — Telephone Encounter (Signed)
If she is still sick, not many options for other antibiotic due to her allergies Would send Rx for levaquin 500mg  daily x 7 if she feels she needs a different antibiotic

## 2018-07-25 NOTE — Telephone Encounter (Signed)
Spoke to pt who states she was seen 12/27 for her CPE and given an abx and prednisone for her sinuses and advised to contact office back with update. Pt has completed both and have had no improvement. She states she contacted [facility] yesterday am and advised a Rx would be sent, but no record of her request. If approved, Rx can be sent to [facility]. pls advise

## 2018-08-28 ENCOUNTER — Ambulatory Visit
Admission: RE | Admit: 2018-08-28 | Discharge: 2018-08-28 | Disposition: A | Discharge status: Home / Self Care | Payer: Medicare HMO | Source: Ambulatory Visit | Attending: Internal Medicine | Admitting: Internal Medicine

## 2018-08-28 DIAGNOSIS — Z780 Asymptomatic menopausal state: Secondary | ICD-10-CM | POA: Diagnosis not present

## 2018-08-28 DIAGNOSIS — E2839 Other primary ovarian failure: Secondary | ICD-10-CM | POA: Diagnosis not present

## 2018-08-28 DIAGNOSIS — Z1231 Encounter for screening mammogram for malignant neoplasm of breast: Secondary | ICD-10-CM | POA: Insufficient documentation

## 2018-08-28 DIAGNOSIS — M85852 Other specified disorders of bone density and structure, left thigh: Secondary | ICD-10-CM | POA: Diagnosis not present

## 2018-11-25 ENCOUNTER — Other Ambulatory Visit: Payer: Self-pay | Admitting: Internal Medicine

## 2019-02-22 ENCOUNTER — Other Ambulatory Visit: Payer: Self-pay | Admitting: Internal Medicine

## 2019-03-04 DIAGNOSIS — H524 Presbyopia: Secondary | ICD-10-CM | POA: Diagnosis not present

## 2019-04-16 ENCOUNTER — Other Ambulatory Visit: Payer: Self-pay | Admitting: Internal Medicine

## 2019-05-02 ENCOUNTER — Other Ambulatory Visit: Payer: Self-pay | Admitting: Internal Medicine

## 2019-06-10 ENCOUNTER — Other Ambulatory Visit: Payer: Self-pay

## 2019-06-10 ENCOUNTER — Encounter: Payer: Self-pay | Admitting: Internal Medicine

## 2019-06-10 ENCOUNTER — Ambulatory Visit (INDEPENDENT_AMBULATORY_CARE_PROVIDER_SITE_OTHER): Payer: Medicare HMO | Admitting: Internal Medicine

## 2019-06-10 DIAGNOSIS — M545 Low back pain, unspecified: Secondary | ICD-10-CM

## 2019-06-10 MED ORDER — PREDNISONE 20 MG PO TABS: 40.0000 mg | ORAL_TABLET | Freq: Every day | ORAL | 0 refills | Status: DC

## 2019-06-10 MED ORDER — TRAMADOL HCL 50 MG PO TABS: 50.0000 mg | ORAL_TABLET | Freq: Three times a day (TID) | ORAL | 0 refills | Status: DC | PRN

## 2019-06-10 NOTE — Assessment & Plan Note (Addendum)
Seems muscular but not clear cut Does not seem like spinal stenosis--but unclear why the leg pain Discussed reassuring exam Will try NSAIDs or tylenol, heat, salon pas, etc May need further evaluation if persists--starting with x-rays

## 2019-06-10 NOTE — Progress Notes (Signed)
Subjective:    Patient ID: Madison Stout, female    DOB: 11-Dec-1950, 68 y.o.   MRN: [id number]  HPI Here due to back pain  Having low back pain intermittenly Awoke with left flank pain over a month ago Did improve some Pain since then---no known injury  Later, she started with bilateral lumbar pain lateral to the spine Not every day Radiates down to back of thighs Can happen with sitting or activity---legs can hurt mostly if she is up for a while Achy type pain in back-can be with sitting also  Tried tramadol--eased it some No OTC analgesics Has tried heating pad and heating rub--all this helps  Current Outpatient Medications on File Prior to Visit  Medication Sig Dispense Refill  . albuterol (PROVENTIL HFA;VENTOLIN HFA) 108 (90 Base) MCG/ACT inhaler Inhale 2 puffs into the lungs every 6 (six) hours as needed. 8.5 g 11  . aspirin 81 MG tablet Take 81 mg by mouth daily.      . fexofenadine (ALLEGRA) 180 MG tablet Take 180 mg by mouth daily.    . fluticasone (FLONASE) 50 MCG/ACT nasal spray USE TWO SPRAYS IN EACH NOSTRIL ONCE DAILY. 48 g 3  . meclizine (ANTIVERT) 25 MG tablet TAKE 1 TABLET BY MOUTH THREE TIMES A DAY AS NEEDED FOR DIZZINESS 270 tablet 0  . traMADol (ULTRAM) 50 MG tablet TAKE 1 TABLET BY MOUTH EVERY 8 HOURS AS NEEDED 50 tablet 0  . triamterene-hydrochlorothiazide (MAXZIDE-25) 37.5-25 MG tablet TAKE 1 TABLET EVERY DAY 90 tablet 3   No current facility-administered medications on file prior to visit.     Allergies  Allergen Reactions  . Clindamycin/Lincomycin     Chest felt tight, breathing off  . Ampicillin   . Penicillins     Past Medical History:  Diagnosis Date  . Allergy   . Asthma   . Hypertension     Past Surgical History:  Procedure Laterality Date  . CESAREAN SECTION    . KIDNEY STONE SURGERY  12/04  . MYOMECTOMY  2005   removed    Family History  Problem Relation Age of Onset  . Dementia Mother   . Hypertension Mother   . Diabetes  Mother   . Breast cancer Mother 82  . Cancer Mother        breast  . Cancer Other 32       breast cancer  . Breast cancer Other     Social History   Socioeconomic History  . Marital status: Married    Spouse name: Not on file  . Number of children: 1  . Years of education: Not on file  . Highest education level: Not on file  Occupational History  . 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 Financial resource strain: Not on file  . Food insecurity    Worry: Not on file    Inability: Not on file  . Transportation needs    Medical: Not on file    Non-medical: Not on file  Tobacco Use  . Smoking status: Never Smoker  . Smokeless tobacco: Never Used  Substance and Sexual Activity  . Alcohol use: No    Alcohol/week: 0.0 standard drinks  . Drug use: No  . Sexual activity: Not on file  Lifestyle  . Physical activity    Days per week: Not on file  Minutes per session: Not on file  . Stress: Not on file  Relationships  . Social Herbalist on phone: Not on file    Gets together: Not on file    Attends religious service: Not on file    Active member of club or organization: Not on file    Attends meetings of clubs or organizations: Not on file    Relationship status: Not on file  . Intimate partner violence    Fear of current or ex partner: Not on file    Emotionally abused: Not on file    Physically abused: Not on file    Forced sexual activity: Not on file  Other Topics Concern  . Not on file  Social History Narrative   No living will   Request husband and daughter as medical decision makers   Would accept resuscitation   No prolonged tube feeds if cognitively unaware   Review of Systems Having spots in eyes---eye doctor related to sinus problems Out of work since March No loss of bowel or bladder control--though has frequency at times  No fever    Objective:   Physical Exam  Constitutional: No distress.  Musculoskeletal:     Comments: Mild low lumbar spine tenderness and paraspinal lumbar tenderness bilaterally SLR negative bilaterally Normal ROM in hips  Neurological:  Normal leg strength Normal gait           Assessment & Plan:

## 2019-06-10 NOTE — Patient Instructions (Signed)
Please continue the heat and try salon pas patch over your low back. Try tylenol regularly (like 1000mg  three times a day). If that doesn't help, try aleve 220mg  2 tabs twice a day with food for 1-2 weeks to see if that helps

## 2019-07-07 ENCOUNTER — Encounter: Payer: Self-pay | Admitting: Internal Medicine

## 2019-07-07 ENCOUNTER — Ambulatory Visit (INDEPENDENT_AMBULATORY_CARE_PROVIDER_SITE_OTHER): Payer: Medicare HMO | Admitting: Internal Medicine

## 2019-07-07 ENCOUNTER — Other Ambulatory Visit: Payer: Self-pay

## 2019-07-07 VITALS — BP 128/76 | HR 82 | Temp 97.1°F | Ht 64.75 in | Wt 228.0 lb

## 2019-07-07 DIAGNOSIS — J4520 Mild intermittent asthma, uncomplicated: Secondary | ICD-10-CM | POA: Diagnosis not present

## 2019-07-07 DIAGNOSIS — Z7189 Other specified counseling: Secondary | ICD-10-CM | POA: Diagnosis not present

## 2019-07-07 DIAGNOSIS — I10 Essential (primary) hypertension: Secondary | ICD-10-CM | POA: Diagnosis not present

## 2019-07-07 DIAGNOSIS — Z1211 Encounter for screening for malignant neoplasm of colon: Secondary | ICD-10-CM

## 2019-07-07 DIAGNOSIS — M8580 Other specified disorders of bone density and structure, unspecified site: Secondary | ICD-10-CM

## 2019-07-07 DIAGNOSIS — Z0000 Encounter for general adult medical examination without abnormal findings: Secondary | ICD-10-CM

## 2019-07-07 DIAGNOSIS — M5440 Lumbago with sciatica, unspecified side: Secondary | ICD-10-CM

## 2019-07-07 DIAGNOSIS — G8929 Other chronic pain: Secondary | ICD-10-CM

## 2019-07-07 DIAGNOSIS — J301 Allergic rhinitis due to pollen: Secondary | ICD-10-CM | POA: Diagnosis not present

## 2019-07-07 LAB — COMPREHENSIVE METABOLIC PANEL
ALT: 13 U/L (ref 0–35)
AST: 17 U/L (ref 0–37)
Albumin: 4.3 g/dL (ref 3.5–5.2)
Alkaline Phosphatase: 79 U/L (ref 39–117)
BUN: 13 mg/dL (ref 6–23)
CO2: 29 mEq/L (ref 19–32)
Calcium: 9.7 mg/dL (ref 8.4–10.5)
Chloride: 99 mEq/L (ref 96–112)
Creatinine, Ser: 1.07 mg/dL (ref 0.40–1.20)
GFR: 61.94 mL/min (ref >60.00)
Glucose, Bld: 97 mg/dL (ref 70–99)
Potassium: 3.6 mEq/L (ref 3.5–5.1)
Sodium: 135 mEq/L (ref 135–145)
Total Bilirubin: 0.5 mg/dL (ref 0.2–1.2)
Total Protein: 8.2 g/dL (ref 6.0–8.3)

## 2019-07-07 LAB — CBC
HCT: 39.4 % (ref 36.0–46.0)
Hemoglobin: 13 g/dL (ref 12.0–15.0)
MCHC: 32.9 g/dL (ref 30.0–36.0)
MCV: 77.8 fl — ABNORMAL LOW (ref 78.0–100.0)
Platelets: 214 10*3/uL (ref 150.0–400.0)
RBC: 5.06 Mil/uL (ref 3.87–5.11)
RDW: 15 % (ref 11.5–15.5)
WBC: 5 10*3/uL (ref 4.0–10.5)

## 2019-07-07 LAB — LIPID PANEL
Cholesterol: 193 mg/dL (ref 0–200)
HDL: 50.8 mg/dL (ref >39.00)
LDL Cholesterol: 112 mg/dL — ABNORMAL HIGH (ref 0–99)
NonHDL: 142.22
Total CHOL/HDL Ratio: 4
Triglycerides: 153 mg/dL — ABNORMAL HIGH (ref 0.0–149.0)
VLDL: 30.6 mg/dL (ref 0.0–40.0)

## 2019-07-07 NOTE — Assessment & Plan Note (Signed)
BP Readings from Last 3 Encounters:  07/07/19 128/76  06/10/19 110/72  07/05/18 124/84   Good control Will recheck labs

## 2019-07-07 NOTE — Assessment & Plan Note (Signed)
Uses tramadol occasionally

## 2019-07-07 NOTE — Progress Notes (Signed)
Subjective:    Patient ID: Madison Stout, female    DOB: 1951-04-15, 68 y.o.   MRN: [id number]  HPI Here with husband for Medicare wellness visit and follow up of chronic health conditions  This visit occurred during the SARS-CoV-2 public health emergency.  Safety protocols were in place, including screening questions prior to the visit, additional usage of staff PPE, and extensive cleaning of exam room while observing appropriate contact time as indicated for disinfecting solutions.   Reviewed form and advanced directives Reviewed other doctors Walking a little---needs to do more Hasn't been working since [facility] to resume eventually Early cataracts and some "black spots"---does okay Hearing is poor---aides too expensive thus far Independence on exercise machine--it tilted. No sig injury (bruise to head) No sig depression or anhedonia--stress with husband (ESRD) Independent with instrumental ADLs Mild memory issues--nothing worrisome  Continues on BP med Doesn't check Regular headaches---?sinus. Does have drainage, etc (sometimes with blood)---?from flonase No chest pain--other than when she pulls a muscle (with movement) No SOB No dizziness or syncope No edema No palpitations  Known arthritis Uses the tramadol for her back as needed Left sciatica  Reviewed DEXA Mild osteopenia Discussed vitamin D  BMI 38 now Discussed eating healthy  Current Outpatient Medications on File Prior to Visit  Medication Sig Dispense Refill  . albuterol (PROVENTIL HFA;VENTOLIN HFA) 108 (90 Base) MCG/ACT inhaler Inhale 2 puffs into the lungs every 6 (six) hours as needed. 8.5 g 11  . aspirin 81 MG tablet Take 81 mg by mouth daily.      . fexofenadine (ALLEGRA) 180 MG tablet Take 180 mg by mouth daily.    . fluticasone (FLONASE) 50 MCG/ACT nasal spray USE TWO SPRAYS IN EACH NOSTRIL ONCE DAILY. 48 g 3  . meclizine (ANTIVERT) 25 MG tablet TAKE 1 TABLET BY MOUTH THREE TIMES A DAY AS NEEDED FOR  DIZZINESS 270 tablet 0  . traMADol (ULTRAM) 50 MG tablet Take 1 tablet (50 mg total) by mouth every 8 (eight) hours as needed. 30 tablet 0  . triamterene-hydrochlorothiazide (MAXZIDE-25) 37.5-25 MG tablet TAKE 1 TABLET EVERY DAY 90 tablet 3   No current facility-administered medications on file prior to visit.    Allergies  Allergen Reactions  . Clindamycin/Lincomycin     Chest felt tight, breathing off  . Ampicillin   . Penicillins     Past Medical History:  Diagnosis Date  . Allergy   . Asthma   . Hypertension     Past Surgical History:  Procedure Laterality Date  . CESAREAN SECTION    . KIDNEY STONE SURGERY  12/04  . MYOMECTOMY  2005   removed    Family History  Problem Relation Age of Onset  . Dementia Mother   . Hypertension Mother   . Diabetes Mother   . Breast cancer Mother 53  . Cancer Mother        breast  . Cancer Other 93       breast cancer  . Breast cancer Other     Social History   Socioeconomic History  . Marital status: Married    Spouse name: Not on file  . Number of children: 1  . Years of education: Not on file  . Highest education level: Not on file  Occupational History  . 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 Smoking  status: Never Smoker  . Smokeless tobacco: Never Used  Substance and Sexual Activity  . Alcohol use: No    Alcohol/week: 0.0 standard drinks  . Drug use: No  . Sexual activity: Not on file  Other Topics Concern  . Not on file  Social History Narrative   No living will   Request husband and daughter as medical decision makers   Would accept resuscitation   No prolonged tube feeds if cognitively unaware   Social Determinants of Health   Financial Resource Strain:   . Difficulty of Paying Living Expenses: Not on file  Food Insecurity:   . Worried About Charity fundraiser in the Last Year: Not  on file  . Ran Out of Food in the Last Year: Not on file  Transportation Needs:   . Lack of Transportation (Medical): Not on file  . Lack of Transportation (Non-Medical): Not on file  Physical Activity:   . Days of Exercise per Week: Not on file  . Minutes of Exercise per Session: Not on file  Stress:   . Feeling of Stress : Not on file  Social Connections:   . Frequency of Communication with Friends and Family: Not on file  . Frequency of Social Gatherings with Friends and Family: Not on file  . Attends Religious Services: Not on file  . Active Member of Clubs or Organizations: Not on file  . Attends Archivist Meetings: Not on file  . Marital Status: Not on file  Intimate Partner Violence:   . Fear of Current or Ex-Partner: Not on file  . Emotionally Abused: Not on file  . Physically Abused: Not on file  . Sexually Abused: Not on file    Review of Systems Appetite is fine Weight is up from last year Sleeps fine Wears seat belt Full dentures No rash or suspicious skin lesions No heartburn or dysphagia Bowels move fine--no blood     Objective:   Physical Exam  Constitutional: She is oriented to person, place, and time. She appears well-developed. No distress.  HENT:  Mouth/Throat: Oropharynx is clear and moist. No oropharyngeal exudate.  Neck: No thyromegaly present.  Cardiovascular: Normal rate, regular rhythm, normal heart sounds and intact distal pulses. Exam reveals no gallop.  No murmur heard. Respiratory: Effort normal and breath sounds normal. No respiratory distress. She has no wheezes. She has no rales.  GI: Soft. There is no abdominal tenderness.  Musculoskeletal:        General: No tenderness or edema.  Lymphadenopathy:    She has no cervical adenopathy.  Neurological: She is alert and oriented to person, place, and time.  President--- "Daisy Floro, [address], [place]" [phone] [id number] Recall 2/3  Skin: No rash noted. No erythema.    Psychiatric: She has a normal mood and affect. Her behavior is normal.           Assessment & Plan:

## 2019-07-07 NOTE — Assessment & Plan Note (Signed)
Quiet Prn albuterol

## 2019-07-07 NOTE — Patient Instructions (Signed)

## 2019-07-07 NOTE — Assessment & Plan Note (Signed)
BMI 38 with HTN, arthritis, etc Still drinks sugared drinks--discussed DASH info

## 2019-07-07 NOTE — Assessment & Plan Note (Signed)
Year round sinus symptoms Uses allegra and flonase

## 2019-07-07 NOTE — Assessment & Plan Note (Signed)
Mild Discussed MVI for D/calcium

## 2019-07-07 NOTE — Assessment & Plan Note (Signed)
I have personally reviewed the Medicare Annual Wellness questionnaire and have noted 1. The patient's medical and social history 2. Their use of alcohol, tobacco or illicit drugs 3. Their current medications and supplements 4. The patient's functional ability including ADL's, fall risks, home safety risks and hearing or visual             impairment. 5. Diet and physical activities 6. Evidence for depression or mood disorders  The patients weight, height, BMI and visual acuity have been recorded in the chart I have made referrals, counseling and provided education to the patient based review of the above and I have provided the pt with a written personalized care plan for preventive services.  I have provided you with a copy of your personalized plan for preventive services. Please take the time to review along with your updated medication list.  Yearly flu vaccine--had for this year Discussed fitness, etc FIT Mammogram every 1-2 years (last in February 2019) Last pap next year

## 2019-07-07 NOTE — Progress Notes (Signed)
Hearing Screening   Method: Audiometry   125Hz  250Hz  500Hz  1000Hz  2000Hz  3000Hz  4000Hz  6000Hz  8000Hz   Right ear:   20 20 20  20     Left ear:   40 0 40  0    Vision Screening Comments: August 2020

## 2019-07-07 NOTE — Assessment & Plan Note (Signed)
See social history 

## 2019-07-15 ENCOUNTER — Other Ambulatory Visit (INDEPENDENT_AMBULATORY_CARE_PROVIDER_SITE_OTHER): Payer: Medicare HMO

## 2019-07-15 ENCOUNTER — Other Ambulatory Visit: Payer: Self-pay | Admitting: Emergency Medicine

## 2019-07-15 DIAGNOSIS — Z1211 Encounter for screening for malignant neoplasm of colon: Secondary | ICD-10-CM

## 2019-07-15 LAB — FECAL OCCULT BLOOD, IMMUNOCHEMICAL: Fecal Occult Bld: NEGATIVE

## 2019-10-01 ENCOUNTER — Other Ambulatory Visit: Payer: Self-pay | Admitting: Internal Medicine

## 2020-02-05 ENCOUNTER — Other Ambulatory Visit: Payer: Self-pay | Admitting: Internal Medicine

## 2020-02-17 ENCOUNTER — Encounter: Payer: Self-pay | Admitting: Internal Medicine

## 2020-02-17 ENCOUNTER — Other Ambulatory Visit: Payer: Self-pay

## 2020-02-17 ENCOUNTER — Ambulatory Visit (INDEPENDENT_AMBULATORY_CARE_PROVIDER_SITE_OTHER): Payer: Medicare HMO | Admitting: Internal Medicine

## 2020-02-17 DIAGNOSIS — G500 Trigeminal neuralgia: Secondary | ICD-10-CM | POA: Diagnosis not present

## 2020-02-17 MED ORDER — OXCARBAZEPINE 300 MG PO TABS: 300.0000 mg | ORAL_TABLET | Freq: Two times a day (BID) | ORAL | 1 refills | Status: DC

## 2020-02-17 NOTE — Assessment & Plan Note (Addendum)
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

## 2020-02-17 NOTE — Progress Notes (Signed)
Subjective:    Patient ID: Madison Stout, female    DOB: 07-21-1950, 69 y.o.   MRN: [id number]  HPI Here due to left ear pain This visit occurred during the SARS-CoV-2 public health emergency.  Safety protocols were in place, including screening questions prior to the visit, additional usage of staff PPE, and extensive cleaning of exam room while observing appropriate contact time as indicated for disinfecting solutions.   Severe pain---"like lightening shooting through it" Started about 2 weeks ago---but now much worse Kept her up a few nights ago  Tried heat topically Tired mineral oil/sweet oil---may have helped very briefly Sleeping on heating pad  No discharge Pain is now on temple and parietal scalp No rash there  Current Outpatient Medications on File Prior to Visit  Medication Sig Dispense Refill  . albuterol (PROVENTIL HFA;VENTOLIN HFA) 108 (90 Base) MCG/ACT inhaler Inhale 2 puffs into the lungs every 6 (six) hours as needed. 8.5 g 11  . aspirin 81 MG tablet Take 81 mg by mouth daily.      . fexofenadine (ALLEGRA) 180 MG tablet Take 180 mg by mouth daily.    . fluticasone (FLONASE) 50 MCG/ACT nasal spray USE TWO SPRAYS IN EACH NOSTRIL ONCE DAILY. 48 g 3  . meclizine (ANTIVERT) 25 MG tablet TAKE 1 TABLET BY MOUTH THREE TIMES A DAY AS NEEDED FOR DIZZINESS 270 tablet 0  . traMADol (ULTRAM) 50 MG tablet Take 1 tablet (50 mg total) by mouth every 8 (eight) hours as needed. 30 tablet 0  . triamterene-hydrochlorothiazide (MAXZIDE-25) 37.5-25 MG tablet TAKE 1 TABLET EVERY DAY 90 tablet 3   No current facility-administered medications on file prior to visit.    Allergies  Allergen Reactions  . Clindamycin/Lincomycin     Chest felt tight, breathing off  . Ampicillin   . Penicillins     Past Medical History:  Diagnosis Date  . Allergy   . Asthma   . Hypertension     Past Surgical History:  Procedure Laterality Date  . CESAREAN SECTION    . KIDNEY STONE SURGERY   12/04  . MYOMECTOMY  2005   removed    Family History  Problem Relation Age of Onset  . Dementia Mother   . Hypertension Mother   . Diabetes Mother   . Breast cancer Mother 92  . Cancer Mother        breast  . Cancer Other 59       breast cancer  . Breast cancer Other     Social History   Socioeconomic History  . Marital status: Married    Spouse name: Not on file  . Number of children: 1  . Years of education: Not on file  . Highest education level: Not on file  Occupational History  . 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 Smoking status: Never Smoker  . Smokeless tobacco: Never Used  Substance and Sexual Activity  . Alcohol use: No    Alcohol/week: 0.0 standard drinks  . Drug use: No  . Sexual activity: Not on file  Other Topics Concern  . Not on file  Social History Narrative   No living will   Request husband and daughter as medical decision makers   Would accept resuscitation   No prolonged tube feeds if cognitively unaware   Social Determinants of Health  Financial Resource Strain:   . Difficulty of Paying Living Expenses:   Food Insecurity:   . Worried About Charity fundraiser in the Last Year:   . Arboriculturist in the Last Year:   Transportation Needs:   . Film/video editor (Medical):   Marland Kitchen Lack of Transportation (Non-Medical):   Physical Activity:   . Days of Exercise per Week:   . Minutes of Exercise per Session:   Stress:   . Feeling of Stress :   Social Connections:   . Frequency of Communication with Friends and Family:   . Frequency of Social Gatherings with Friends and Family:   . Attends Religious Services:   . Active Member of Clubs or Organizations:   . Attends Archivist Meetings:   Marland Kitchen Marital Status:   Intimate Partner Violence:   . Fear of Current or Ex-Partner:   . Emotionally Abused:   Marland Kitchen  Physically Abused:   . Sexually Abused:    Review of Systems No tinnitus Hearing is fine Some vertigo--uses prn meclizine prn (not new) No headache No vision changes Full dentures    Objective:   Physical Exam HENT:     Head:     Comments: No temporal bruit    Left Ear: Tympanic membrane and ear canal normal.  Neurological:     Comments: hyperaesthesia in V1 distribution on left            Assessment & Plan:

## 2020-03-10 ENCOUNTER — Other Ambulatory Visit: Payer: Self-pay | Admitting: Internal Medicine

## 2020-03-16 DIAGNOSIS — G500 Trigeminal neuralgia: Secondary | ICD-10-CM | POA: Diagnosis not present

## 2020-03-18 ENCOUNTER — Other Ambulatory Visit: Payer: Self-pay | Admitting: Acute Care

## 2020-03-18 DIAGNOSIS — G500 Trigeminal neuralgia: Secondary | ICD-10-CM

## 2020-04-01 ENCOUNTER — Telehealth: Payer: Self-pay

## 2020-04-01 NOTE — Telephone Encounter (Signed)
Per appt notes pt already has appt with Dr Diona Browner on 04/02/20 at 12 noon.

## 2020-04-01 NOTE — Telephone Encounter (Signed)
[facility] - Client TELEPHONE ADVICE RECORD [facility] Patient Name: Madison Stout Gender: Female DOB: [birth year] Age: 69 Y 37 M 27 D Return Phone Number: [phone] (Primary), [phone] (Secondary) Address: City/State/Zip: [place] [address] Client [facility] - Client Client Site [facility] - Day Physician Viviana Simpler- MD Contact Type Call Who Is Calling Patient / Member / Family / Caregiver Call Type Triage / Clinical Relationship To Patient Self Return Phone Number [phone] (Primary) Chief Complaint Hives Reason for Call Symptom or clinical information Initial Comment Caller states she has a widespread rash and some swelling behind her ear due to taking Oxcarbazepine. Her medication was recently increased and swelling has worsened. Translation No Nurse Assessment Nurse: Vallery Sa, RN, Melana Date/Time (Eastern Time): 04/01/2020 9:25:47 AM Confirm and document reason for call. If symptomatic, describe symptoms. ---Madison Stout states that her Oxcarbazepine was increased several weeks ago and that she developed widespread Hives about 8-10 days ago. No severe breathing or swallowing difficulty. No wheezing. No fever. She developed a red bump behind her ear last night. No pain. Alert and responsive. Does the patient have any new or worsening symptoms? ---Yes Will a triage be completed? ---Yes Related visit to physician within the last 2 weeks? ---Yes Does the PT have any chronic conditions? (i.e. diabetes, asthma, this includes High risk factors for pregnancy, etc.) ---Yes List chronic conditions. ---Asthma, High Blood Pressure, Headaches Is this a behavioral health or substance abuse call? ---No Guidelines Guideline Title Affirmed Question Affirmed Notes Nurse Date/Time (Eastern Time) Hives [1] MODERATESEVERE hives persist (i.e., hives interfere with normal activities or work) AND [2]  taking antihistamine (e.g., Benadryl, Claritin) > 24 hours Vallery Sa, RN, Summerlyn 04/01/2020 9:30:19 AM PLEASE NOTE: All timestamps contained within this report are represented as [place] Standard Time. CONFIDENTIALTY NOTICE: This fax transmission is intended only for the addressee. It contains information that is legally privileged, confidential or otherwise protected from use or disclosure. If you are not the intended recipient, you are strictly prohibited from reviewing, disclosing, copying using or disseminating any of this information or taking any action in reliance on or regarding this information. If you have received this fax in error, please notify us immediately by telephone so that we can arrange for its return to [place]. Phone: [phone], Toll-Free: [phone], Fax: [phone] Page: 2 of 2 Call Id: [id number] Guidelines Guideline Title Affirmed Question Affirmed Notes Nurse Date/Time Eilene Ghazi Time) Lymph Nodes - Swollen [1] Single large node AND [2] size > 1 inch (2.5 cm) AND [3] no fever Vallery Sa, RN, Johnny 04/01/2020 9:33:07 AM Disp. Time Eilene Ghazi Time) Disposition Final User 04/01/2020 9:32:34 AM See PCP within 24 Hours Tr[place]ull, RN, Corby 04/01/2020 9:34:59 AM See PCP within 24 Hours Yes Trumbull, RN, Madison Stout Caller Disagree/Comply Comply Caller Understands Yes PreDisposition Call Doctor Care Advice Given Per Guideline SEE PCP WITHIN 24 HOURS: * IF OFFICE WILL BE OPEN: You need to be examined within the next 24 hours. Call your doctor (or NP/PA) when the office opens and make an appointment. ANTIHISTAMINE MEDICINES FOR WIDESPREAD HIVES: * For widespread hives, take either cetirizine or loratadine. They can help reduce the rash, itching, and other allergic symptoms. * CETIRIZINE (REACTINE, ZYRTEC): The adult dose is 10 mg and you take it once a day. Cetirizine is available in the [place] as Zyrtec and in [place] as Reactine. * LORATADINE (ALAVERT, CLARITIN): The adult  dose is 10 mg and you take it once a day.  Loratadine is available in the [place] as Engineer, maintenance (IT); it is available in [place] as Claritin. * They are over-the-counter (OTC) antihistamine medicines. You can buy them at the drugstore. * Diphenhydramine (Benadryl) is a FIRST GENERATION ANTIHISTAMINE medicine. It causes more sleepiness than the newer second generation antihistamine medicines. The adult dosage of Benadryl is 25 to 50 mg by mouth and you can take it up to 4 times a day. ANTIHISTAMINE MEDICINES - EXTRA NOTES AND WARNINGS: * Antihistamine medicines can be used to treat allergic reactions, allergies, hay fever, hives, and itching. COOL BATH FOR ITCHING: * Take a cool bath for 10 minutes to relieve itching. (Caution: avoid any chill). * Rub very itchy areas with an ice cube for 10 minutes. PREVENTION - REMOVE ALLERGENS: * Take a bath or shower if triggered by pollens or animal contact. * Change clothes. CALL BACK IF: * You become worse CARE ADVICE given per Hives (Adult) guideline. SEE PCP WITHIN 24 HOURS: * IF OFFICE WILL BE OPEN: You need to be examined within the next 24 hours. Call your doctor (or NP/PA) when the office opens and make an appointment. PAIN MEDICINES: * ACETAMINOPHEN - REGULAR STRENGTH TYLENOL: Take 650 mg (two 325 mg pills) by mouth every 4 to 6 hours as needed. Each Regular Strength Tylenol pill has 325 mg of acetaminophen. The most you should take each day is 3,250 mg (10 pills a day). CALL BACK IF: * You become worse CARE ADVICE given per Lymph Nodes Swollen (Adult) guideline. Comments User: Berton Mount, RN Date/Time Eilene Ghazi Time): 04/01/2020 9:37:10 AM Oxcarbazepine side effects: Seek medical treatment if you have a serious drug reaction that can affect many parts of your body. Symptoms may include: skin rash, swollen glands. (Her next dose is due at 5pm. She will seek futher medications directions from MD.) Referrals REFERRED TO PCP OFFICE

## 2020-04-01 NOTE — Telephone Encounter (Signed)
Spoke to pt. She has spoken to neurology and they advised her to stop the medication. They did give her something else she could take if she wanted. She may not take it and deal with the pain until after seeing Dr Diona Browner tomorrow. She said the rash has moved to her face. I told her if the rash worsens and she cannot wait until tomorrow, go to an UC today.

## 2020-04-01 NOTE — Telephone Encounter (Signed)
I am willing to see patient tommorow but the med was prescribed by neurology not  PCP... she needs to call there ASAP for their recommendations on what to do with the med. Likely stop ASAP if felt to be SE/ reaction.

## 2020-04-02 ENCOUNTER — Other Ambulatory Visit: Payer: Self-pay

## 2020-04-02 ENCOUNTER — Telehealth (INDEPENDENT_AMBULATORY_CARE_PROVIDER_SITE_OTHER): Payer: Medicare HMO | Admitting: Family Medicine

## 2020-04-02 ENCOUNTER — Encounter: Payer: Self-pay | Admitting: Family Medicine

## 2020-04-02 DIAGNOSIS — G500 Trigeminal neuralgia: Secondary | ICD-10-CM

## 2020-04-02 DIAGNOSIS — R21 Rash and other nonspecific skin eruption: Secondary | ICD-10-CM | POA: Diagnosis not present

## 2020-04-02 MED ORDER — PREDNISONE 20 MG PO TABS: ORAL_TABLET | ORAL | 0 refills | Status: DC

## 2020-04-02 NOTE — Assessment & Plan Note (Addendum)
Stop oxcarbazepine.. change to gabapentin as directed by Neurology.

## 2020-04-02 NOTE — Assessment & Plan Note (Signed)
No red flags. Likely med allergy vs intolerance  Stop carbamazepine. Continue antihistamine , can use Benadryl at night.  Start prednisone taper.  Go to ER if SOB or oral swelling.

## 2020-04-02 NOTE — Progress Notes (Signed)
VIRTUAL VISIT Due to national recommendations of social distancing due to [place] 19, a virtual visit is felt to be most appropriate for this patient at this time.   I connected with the patient on 04/02/20 at 12:00 PM EDT by virtual telehealth platform and verified that I am speaking with the correct person using two identifiers.   I discussed the limitations, risks, security and privacy concerns of performing an evaluation and management service by  virtual telehealth platform and the availability of in person appointments. I also discussed with the patient that there may be a patient responsible charge related to this service. The patient expressed understanding and agreed to proceed.  Patient location: Home Provider Location: [facility] [place] Participants: Eliezer Lofts and Myriam Forehand   Chief Complaint  Patient presents with  . Rash    pt states took medication oxcarbazepine 02/17/2020 and notice rash two days later.  Rash on neck, elbow, breast and patches and bumps on lower back    History of Present Illness:  69 year old female  Patient of Dr. Silvio Pate with history of trigeminal neuralgia presents with new onset rash.  She reports Dr. Silvio Pate started her on trileptal on 02/17/2020... 2 days later she noted mild rash. A that time she noted fine red bumps under breast , inside elbows and neck.  Rash was itchy. She continued the medication.  Saw Neurology on 03/16/2020... in creased to BID 03/23/2020.  Rash spread to face.Marland Kitchen all over body.  Mildly itchy.  No other new changes, no known exposure.  She denies dysphagia, no oral swelling, no SOB, no fever.  Neurologist told her to stop trileptal .. stopped 9/23.. changed to gabapentin.   She has not treated rash with anything.  COVID 19 screen No recent travel or known exposure to COVID19 The patient denies respiratory symptoms of COVID 19 at this time.  The importance of social distancing was discussed today.   Review of Systems   Constitutional: Negative for chills and fever.  HENT: Negative for congestion and ear pain.   Eyes: Negative for pain and redness.  Respiratory: Negative for cough and shortness of breath.   Cardiovascular: Negative for chest pain, palpitations and leg swelling.  Gastrointestinal: Negative for abdominal pain, blood in stool, constipation, diarrhea, nausea and vomiting.  Genitourinary: Negative for dysuria.  Musculoskeletal: Negative for falls and myalgias.  Skin: Positive for rash.  Neurological: Negative for dizziness.  Psychiatric/Behavioral: Negative for depression. The patient is not nervous/anxious.       Past Medical History:  Diagnosis Date  . Allergy   . Asthma   . Hypertension     reports that she has never smoked. She has never used smokeless tobacco. She reports that she does not drink alcohol and does not use drugs.   Current Outpatient Medications:  .  albuterol (PROVENTIL HFA;VENTOLIN HFA) 108 (90 Base) MCG/ACT inhaler, Inhale 2 puffs into the lungs every 6 (six) hours as needed., Disp: 8.5 g, Rfl: 11 .  aspirin 81 MG tablet, Take 81 mg by mouth daily.  , Disp: , Rfl:  .  fexofenadine (ALLEGRA) 180 MG tablet, Take 180 mg by mouth daily., Disp: , Rfl:  .  fluticasone (FLONASE) 50 MCG/ACT nasal spray, USE TWO SPRAYS IN EACH NOSTRIL ONCE DAILY., Disp: 48 g, Rfl: 3 .  gabapentin (NEURONTIN) 100 MG capsule, Take 1 cap three times a day for a week then increase to 2 caps three times a day, Disp: , Rfl:  .  meclizine (ANTIVERT) 25 MG tablet, TAKE 1 TABLET BY MOUTH THREE TIMES A DAY AS NEEDED FOR DIZZINESS, Disp: 270 tablet, Rfl: 0 .  traMADol (ULTRAM) 50 MG tablet, Take 1 tablet (50 mg total) by mouth every 8 (eight) hours as needed., Disp: 30 tablet, Rfl: 0 .  triamterene-hydrochlorothiazide (MAXZIDE-25) 37.5-25 MG tablet, TAKE 1 TABLET EVERY DAY, Disp: 90 tablet, Rfl: 3 .  Oxcarbazepine (TRILEPTAL) 300 MG tablet, TAKE 1 TABLET (300 MG TOTAL) BY MOUTH 2 (TWO) TIMES DAILY.  (Patient not taking: Reported on 04/02/2020), Disp: 60 tablet, Rfl: 2   Observations/Objective: Blood pressure (!) 148/83, pulse 91, temperature (!) 96.8 F (36 C), temperature source Temporal, height 5' 4.5" (1.638 m), weight 225 lb 5 oz (102.2 kg), SpO2 97 %.  Physical Exam  Physical Exam Constitutional:      General: The patient is not in acute distress.  Skin: diffuse fine erythematous bumps on neck Pulmonary:     Effort: Pulmonary effort is normal. No respiratory distress.  Neurological:     Mental Status: The patient is alert and oriented to person, place, and time.  Psychiatric:        Mood and Affect: Mood normal.        Behavior: Behavior normal.   Assessment and Plan Rash No red flags. Likely med allergy vs intolerance  Stop carbamazepine. Continue antihistamine , can use Benadryl at night.  Start prednisone taper.  Go to ER if SOB or oral swelling.  Trigeminal neuralgia of left side of face Stop oxcarbazepine.. change to gabapentin as directed by Neurology.     I discussed the assessment and treatment plan with the patient. The patient was provided an opportunity to ask questions and all were answered. The patient agreed with the plan and demonstrated an understanding of the instructions.   The patient was advised to call back or seek an in-person evaluation if the symptoms worsen or if the condition fails to improve as anticipated.     Eliezer Lofts, MD

## 2020-04-07 ENCOUNTER — Other Ambulatory Visit: Payer: Self-pay

## 2020-04-07 ENCOUNTER — Ambulatory Visit
Admission: RE | Admit: 2020-04-07 | Discharge: 2020-04-07 | Disposition: A | Discharge status: Home / Self Care | Payer: Medicare HMO | Source: Ambulatory Visit | Attending: Acute Care | Admitting: Acute Care

## 2020-04-07 DIAGNOSIS — R9082 White matter disease, unspecified: Secondary | ICD-10-CM | POA: Diagnosis not present

## 2020-04-07 DIAGNOSIS — G43909 Migraine, unspecified, not intractable, without status migrainosus: Secondary | ICD-10-CM | POA: Diagnosis not present

## 2020-04-07 DIAGNOSIS — G500 Trigeminal neuralgia: Secondary | ICD-10-CM | POA: Diagnosis not present

## 2020-04-20 DIAGNOSIS — G500 Trigeminal neuralgia: Secondary | ICD-10-CM | POA: Diagnosis not present

## 2020-04-26 DIAGNOSIS — B360 Pityriasis versicolor: Secondary | ICD-10-CM | POA: Diagnosis not present

## 2020-04-29 DIAGNOSIS — H9202 Otalgia, left ear: Secondary | ICD-10-CM | POA: Diagnosis not present

## 2020-04-29 DIAGNOSIS — R519 Headache, unspecified: Secondary | ICD-10-CM | POA: Diagnosis not present

## 2020-05-11 DIAGNOSIS — G500 Trigeminal neuralgia: Secondary | ICD-10-CM | POA: Diagnosis not present

## 2020-05-20 DIAGNOSIS — H9202 Otalgia, left ear: Secondary | ICD-10-CM | POA: Diagnosis not present

## 2020-05-20 DIAGNOSIS — G501 Atypical facial pain: Secondary | ICD-10-CM | POA: Diagnosis not present

## 2020-06-10 DIAGNOSIS — G500 Trigeminal neuralgia: Secondary | ICD-10-CM | POA: Diagnosis not present

## 2020-06-10 DIAGNOSIS — H9202 Otalgia, left ear: Secondary | ICD-10-CM | POA: Diagnosis not present

## 2020-06-10 DIAGNOSIS — H903 Sensorineural hearing loss, bilateral: Secondary | ICD-10-CM | POA: Diagnosis not present

## 2020-06-10 DIAGNOSIS — H6123 Impacted cerumen, bilateral: Secondary | ICD-10-CM | POA: Diagnosis not present

## 2020-07-07 ENCOUNTER — Ambulatory Visit (INDEPENDENT_AMBULATORY_CARE_PROVIDER_SITE_OTHER): Payer: Medicare HMO | Admitting: Internal Medicine

## 2020-07-07 ENCOUNTER — Encounter: Payer: Self-pay | Admitting: Internal Medicine

## 2020-07-07 ENCOUNTER — Other Ambulatory Visit: Payer: Self-pay

## 2020-07-07 VITALS — BP 118/78 | HR 90 | Temp 97.5°F | Ht 64.5 in | Wt 236.0 lb

## 2020-07-07 DIAGNOSIS — G500 Trigeminal neuralgia: Secondary | ICD-10-CM

## 2020-07-07 DIAGNOSIS — J4520 Mild intermittent asthma, uncomplicated: Secondary | ICD-10-CM | POA: Diagnosis not present

## 2020-07-07 DIAGNOSIS — Z1211 Encounter for screening for malignant neoplasm of colon: Secondary | ICD-10-CM | POA: Diagnosis not present

## 2020-07-07 DIAGNOSIS — Z0000 Encounter for general adult medical examination without abnormal findings: Secondary | ICD-10-CM | POA: Diagnosis not present

## 2020-07-07 DIAGNOSIS — I10 Essential (primary) hypertension: Secondary | ICD-10-CM

## 2020-07-07 LAB — COMPREHENSIVE METABOLIC PANEL
ALT: 13 U/L (ref 0–35)
AST: 18 U/L (ref 0–37)
Albumin: 4.4 g/dL (ref 3.5–5.2)
Alkaline Phosphatase: 68 U/L (ref 39–117)
BUN: 19 mg/dL (ref 6–23)
CO2: 28 mEq/L (ref 19–32)
Calcium: 9.6 mg/dL (ref 8.4–10.5)
Chloride: 100 mEq/L (ref 96–112)
Creatinine, Ser: 1.04 mg/dL (ref 0.40–1.20)
GFR: 55.52 mL/min — ABNORMAL LOW (ref >60.00)
Glucose, Bld: 70 mg/dL (ref 70–99)
Potassium: 3.4 mEq/L — ABNORMAL LOW (ref 3.5–5.1)
Sodium: 137 mEq/L (ref 135–145)
Total Bilirubin: 0.5 mg/dL (ref 0.2–1.2)
Total Protein: 8 g/dL (ref 6.0–8.3)

## 2020-07-07 LAB — CBC
HCT: 39.1 % (ref 36.0–46.0)
Hemoglobin: 12.7 g/dL (ref 12.0–15.0)
MCHC: 32.6 g/dL (ref 30.0–36.0)
MCV: 76.9 fl — ABNORMAL LOW (ref 78.0–100.0)
Platelets: 213 10*3/uL (ref 150.0–400.0)
RBC: 5.08 Mil/uL (ref 3.87–5.11)
RDW: 14.7 % (ref 11.5–15.5)
WBC: 5.4 10*3/uL (ref 4.0–10.5)

## 2020-07-07 MED ORDER — GABAPENTIN 100 MG PO CAPS: ORAL_CAPSULE | ORAL | 3 refills | Status: DC

## 2020-07-07 NOTE — Progress Notes (Signed)
Subjective:    Patient ID: Madison Stout, female    DOB: 05/18/1951, 69 y.o.   MRN: [id number]  HPI Here with husband for Medicare wellness visit and follow up of chronic health conditions This visit occurred during the SARS-CoV-2 public health emergency.  Safety protocols were in place, including screening questions prior to the visit, additional usage of staff PPE, and extensive cleaning of exam room while observing appropriate contact time as indicated for disinfecting solutions.   Reviewed form and advanced directives Reviewed other doctors Vision is off slightly---due for eye exam Recent hearing evaluations No alcohol or tobacco Does a little walking--but not much exercise No falls No depression or anhedonia Independent with instrumental ADLs Some memory problems---?related to gabapentin  Still with trigeminal neuralgia---or may not be that Going to [place] Now on gabapentin--worst when she bends over, or at night lying down May be considered for gamma knife Having multiple hearing tests---some decline recently (may need hearing aides)  Some increased trouble with left knee Relates to her weight Uses tramadol rarely. Uses tylenol at times  Continues on BP med Rare brief chest pain---not with exertion No SOB No palpitations No dizziness or syncope No edema Some headaches---probably related to the facial pain  Asthma has been quiet Has albuterol but hasn't needed  Weight up slightly Still sweet tea ---coffee with sugar.   Current Outpatient Medications on File Prior to Visit  Medication Sig Dispense Refill  . albuterol (PROVENTIL HFA;VENTOLIN HFA) 108 (90 Base) MCG/ACT inhaler Inhale 2 puffs into the lungs every 6 (six) hours as needed. 8.5 g 11  . aspirin 81 MG tablet Take 81 mg by mouth daily.    . fexofenadine (ALLEGRA) 180 MG tablet Take 180 mg by mouth daily.    . fluticasone (FLONASE) 50 MCG/ACT nasal spray USE TWO SPRAYS IN EACH NOSTRIL ONCE DAILY. 48 g 3  .  gabapentin (NEURONTIN) 100 MG capsule Take 1 cap three times a day for a week then increase to 2 caps three times a day    . meclizine (ANTIVERT) 25 MG tablet TAKE 1 TABLET BY MOUTH THREE TIMES A DAY AS NEEDED FOR DIZZINESS 270 tablet 0  . traMADol (ULTRAM) 50 MG tablet Take 1 tablet (50 mg total) by mouth every 8 (eight) hours as needed. 30 tablet 0  . triamterene-hydrochlorothiazide (MAXZIDE-25) 37.5-25 MG tablet TAKE 1 TABLET EVERY DAY 90 tablet 3   No current facility-administered medications on file prior to visit.    Allergies  Allergen Reactions  . Clindamycin/Lincomycin     Chest felt tight, breathing off  . Ampicillin   . Penicillins   . Trileptal [Oxcarbazepine] Rash    Past Medical History:  Diagnosis Date  . Allergy   . Asthma   . Hypertension     Past Surgical History:  Procedure Laterality Date  . CESAREAN SECTION    . KIDNEY STONE SURGERY  12/04  . MYOMECTOMY  2005   removed    Family History  Problem Relation Age of Onset  . Dementia Mother   . Hypertension Mother   . Diabetes Mother   . Breast cancer Mother 30  . Cancer Mother        breast  . Cancer Other 28       breast cancer  . Breast cancer Other     Social History   Socioeconomic History  . Marital status: Married    Spouse name: Not on file  . Number of children: 1  .  Years of education: Not on file  . Highest education level: Not on file  Occupational History  . 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[id number]    Comment: Retired  . Occupation: Chief Financial Officer    Comment: for [employer]part time  Tobacco Use  . Smoking status: Never Smoker  . Smokeless tobacco: Never Used  Substance and Sexual Activity  . Alcohol use: No    Alcohol/week: 0.0 standard drinks  . Drug use: No  . Sexual activity: Not on file  Other Topics Concern  . Not on file  Social History Narrative   No living will   Request husband and daughter as medical decision makers   Would accept resuscitation    No prolonged tube feeds if cognitively unaware   Social Determinants of Health   Financial Resource Strain: Not on file  Food Insecurity: Not on file  Transportation Needs: Not on file  Physical Activity: Not on file  Stress: Not on file  Social Connections: Not on file  Intimate Partner Violence: Not on file   Review of Systems Right 4th finger is getting caught --but not triggering Had rash on first medication for facial pain--but turned out to be yeast (was treated) Appetite is fine Sleeps okay Wears seat belt Full dentures No heartburn or dysphagia Bowels are fine with stool softeners. No blood No dysuria or hematuria. Some frequency. No incontinence Some left back pain at times--after housework/mowing    Objective:   Physical Exam Constitutional:      Appearance: Normal appearance.  HENT:     Mouth/Throat:     Comments: No lesions Eyes:     Conjunctiva/sclera: Conjunctivae normal.     Pupils: Pupils are equal, round, and reactive to light.  Cardiovascular:     Rate and Rhythm: Normal rate and regular rhythm.     Pulses: Normal pulses.     Heart sounds: No murmur heard. No gallop.   Pulmonary:     Effort: Pulmonary effort is normal.     Breath sounds: Normal breath sounds. No wheezing or rales.  Abdominal:     Palpations: Abdomen is soft.     Tenderness: There is no abdominal tenderness.  Musculoskeletal:     Cervical back: Neck supple.     Right lower leg: No edema.     Left lower leg: No edema.  Lymphadenopathy:     Cervical: No cervical adenopathy.  Skin:    General: Skin is warm.     Findings: No rash.  Neurological:     Mental Status: She is alert and oriented to person, place, and time.     Comments: President-- "Manual Meier Obama" [phone] Recall 3/3  Psychiatric:        Mood and Affect: Mood normal.        Behavior: Behavior normal.            Assessment & Plan:

## 2020-07-07 NOTE — Progress Notes (Signed)
Hearing Screening   125Hz  250Hz  500Hz  1000Hz  2000Hz  3000Hz  4000Hz  6000Hz  8000Hz   Right ear:           Left ear:           Comments: December 2021   Visual Acuity Screening   Right eye Left eye Both eyes  Without correction:     With correction: 20/50 20/25 20/25

## 2020-07-07 NOTE — Assessment & Plan Note (Signed)
MRI did not show vascular compression Ongoing neurology evaluation On gabapentin for this

## 2020-07-07 NOTE — Assessment & Plan Note (Signed)
I have personally reviewed the Medicare Annual Wellness questionnaire and have noted 1. The patient's medical and social history 2. Their use of alcohol, tobacco or illicit drugs 3. Their current medications and supplements 4. The patient's functional ability including ADL's, fall risks, home safety risks and hearing or visual             impairment. 5. Diet and physical activities 6. Evidence for depression or mood disorders  The patients weight, height, BMI and visual acuity have been recorded in the chart I have made referrals, counseling and provided education to the patient based review of the above and I have provided the pt with a written personalized care plan for preventive services.  I have provided you with a copy of your personalized plan for preventive services. Please take the time to review along with your updated medication list.  Had COVID booster and flu vaccine FIT again She will set up screening mammogram No pap due to age Consider shingrix at pharmacy

## 2020-07-07 NOTE — Assessment & Plan Note (Signed)
BP Readings from Last 3 Encounters:  07/07/20 118/78  04/02/20 (!) 148/83  02/17/20 (!) 146/82   Good control Will continue the HCTZ/triameterene Due for labs

## 2020-07-07 NOTE — Assessment & Plan Note (Signed)
BMI 39 with HTN, arthritis, etc Urged her to stop the sugared drinks and increase exercise

## 2020-07-07 NOTE — Assessment & Plan Note (Signed)
Quiet Uses the albuterol rarely

## 2020-07-13 ENCOUNTER — Telehealth: Payer: Self-pay

## 2020-07-13 NOTE — Telephone Encounter (Signed)
Madison Stout came in the office requesting to speak with the nurse about the prescription gabapentin (NEURONTIN) 100 MG capsule. Patient states she thought the prescription was supposed to be 300 MG not 100 MG. Wanting to know if someone could call her and clarify,

## 2020-07-13 NOTE — Telephone Encounter (Signed)
Pt says [facility] increased her from 100mg  to 300mg  and when she came in she had the 300mg . She is currently taking the 300mg  1 capsule 3 times daily. Wanted to clear this with Dr before sending to [facility].

## 2020-07-14 ENCOUNTER — Other Ambulatory Visit (INDEPENDENT_AMBULATORY_CARE_PROVIDER_SITE_OTHER): Payer: Medicare HMO

## 2020-07-14 ENCOUNTER — Telehealth: Payer: Self-pay | Admitting: Radiology

## 2020-07-14 DIAGNOSIS — Z1211 Encounter for screening for malignant neoplasm of colon: Secondary | ICD-10-CM | POA: Diagnosis not present

## 2020-07-14 LAB — FECAL OCCULT BLOOD, IMMUNOCHEMICAL: Fecal Occult Bld: POSITIVE — AB

## 2020-07-14 MED ORDER — GABAPENTIN 300 MG PO CAPS: 300.0000 mg | ORAL_CAPSULE | Freq: Three times a day (TID) | ORAL | 3 refills | Status: DC

## 2020-07-14 NOTE — Telephone Encounter (Signed)
[facility] called a positive ifob, results sent to Dr Alphonsus Sias, Nicki Reaper

## 2020-07-14 NOTE — Addendum Note (Signed)
Addended by: Eual Fines on: 07/14/2020 03:54 PM   Modules accepted: Orders

## 2020-07-14 NOTE — Telephone Encounter (Signed)
OK for PCP to address tomorrow

## 2020-07-14 NOTE — Telephone Encounter (Signed)
That is fine Okay to send for a year

## 2020-07-14 NOTE — Telephone Encounter (Signed)
Spoke to pt. Rx sent to [facility]

## 2020-07-15 NOTE — Telephone Encounter (Signed)
Message sent on MyChart. Will need colonoscopy--can check with her next week to see if she prefers [facility] or [address]

## 2020-07-16 ENCOUNTER — Other Ambulatory Visit: Payer: Self-pay | Admitting: Internal Medicine

## 2020-07-16 ENCOUNTER — Telehealth: Payer: Self-pay | Admitting: Internal Medicine

## 2020-07-16 DIAGNOSIS — R195 Other fecal abnormalities: Secondary | ICD-10-CM

## 2020-07-16 NOTE — Telephone Encounter (Signed)
Please see result note 

## 2020-07-16 NOTE — Telephone Encounter (Signed)
Pt returing calls

## 2020-07-28 ENCOUNTER — Telehealth: Payer: Self-pay

## 2020-07-28 NOTE — Telephone Encounter (Signed)
Patient called and stated that she was referred to have a colonoscopy in [place] [facility] [address]. Patient stated that no one ever called her to set up an appointment, but she received a notification that said her appointment was last Wednesday and she missed it. Patient stated that she called to the center on [address] yesterday, but they said that she should call her PCP, so that they can make another appointment for her. Could you please help me out with this?

## 2020-07-28 NOTE — Telephone Encounter (Signed)
Spoke with patient. Missed appointment on 07/14/20 with Shelba Flake was a lab appointment. That day her FIT test was resulted as positive. Per referral notes no one has called her from [facility] so nothing for colonoscopy has been scheduled yet. I called [facility] office and they will call her today to review and schedule. Nothing further is needed.

## 2020-08-06 ENCOUNTER — Other Ambulatory Visit: Payer: Self-pay | Admitting: Internal Medicine

## 2020-08-16 ENCOUNTER — Other Ambulatory Visit: Payer: Self-pay | Admitting: Internal Medicine

## 2020-08-16 DIAGNOSIS — Z1231 Encounter for screening mammogram for malignant neoplasm of breast: Secondary | ICD-10-CM

## 2020-09-02 ENCOUNTER — Ambulatory Visit: Payer: Medicare HMO | Admitting: Gastroenterology

## 2020-09-02 ENCOUNTER — Encounter: Payer: Self-pay | Admitting: Gastroenterology

## 2020-09-02 VITALS — BP 160/80 | HR 96 | Ht 64.25 in | Wt 238.1 lb

## 2020-09-02 DIAGNOSIS — R195 Other fecal abnormalities: Secondary | ICD-10-CM

## 2020-09-02 MED ORDER — PLENVU 140 G PO SOLR: 140.0000 g | ORAL | 0 refills | Status: DC

## 2020-09-02 NOTE — Patient Instructions (Signed)
If you are age 68 or older, your body mass index should be between 23-30. Your Body mass index is 40.56 kg/m. If this is out of the aforementioned range listed, please consider follow up with your Primary Care Provider.  If you are age 19 or younger, your body mass index should be between 19-25. Your Body mass index is 40.56 kg/m. If this is out of the aformentioned range listed, please consider follow up with your Primary Care Provider.   You have been scheduled for a colonoscopy. Please follow written instructions given to you at your visit today.  Please pick up your prep supplies at the pharmacy within the next 1-3 days. If you use inhalers (even only as needed), please bring them with you on the day of your procedure.  It was a pleasure to see you today!  Dr. Loletha Carrow

## 2020-09-02 NOTE — Progress Notes (Signed)
[facility] Consult Note:  History: Madison Stout 09/02/2020  Referring provider: Venia Carbon, MD  Reason for consult/chief complaint: heme positive stool test   Subjective  HPI:  This is a very pleasant 70 year old woman referred by primary care for heme positive stool on a recent routine check.  This is the first time her stool test has come up positive, and she has not previously had a colonoscopy.  Madison Stout denies abdominal pain other than occasional "upset stomach" that she feels may be from some food that did not agree with her.  Her bowel habits are regular and she denies rectal bleeding.  There is occasional scant blood on the paper because she feels that sometimes she cleans too much after bowel movement.  She denies chronic upper digestive symptoms such as dysphagia, odynophagia, nausea vomiting or early satiety.  Appetite has been good and weight stable.   ROS:  Review of Systems  Constitutional: Negative for appetite change and unexpected weight change.  HENT: Negative for mouth sores and voice change.   Eyes: Negative for pain and redness.  Respiratory: Negative for cough and shortness of breath.   Cardiovascular: Negative for chest pain and palpitations.  Genitourinary: Negative for dysuria and hematuria.  Musculoskeletal: Negative for arthralgias and myalgias.  Skin: Negative for pallor and rash.  Neurological: Negative for weakness and headaches.  Hematological: Negative for adenopathy.     Past Medical History: Past Medical History:  Diagnosis Date  . Allergy   . Asthma   . Chronic headaches   . Hypertension   . Kidney stones   . Pneumonia      Past Surgical History: Past Surgical History:  Procedure Laterality Date  . CESAREAN SECTION    . MYOMECTOMY  2005   removed     Family History: Family History  Problem Relation Age of Onset  . Dementia Mother   . Hypertension Mother   . Diabetes Mother   . Breast cancer  Mother 70  . Uterine cancer Mother   . Heart disease Father   . Hypertension Father   . Asthma Sister   . Uterine cancer Maternal Grandmother   . Parkinson's disease Sister   . Other Sister        died at birth  . Hypertension Sister   . Breast cancer Niece 47   No family history of colon cancer  Social History: Social History   Socioeconomic History  . Marital status: Married    Spouse name: Not on file  . Number of children: 1  . Years of education: Not on file  . Highest education level: Not on file  Occupational History  . 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 Smoking status: Never Smoker  . Smokeless tobacco: Never Used  Vaping Use  . Vaping Use: Never used  Substance and Sexual Activity  . Alcohol use: No    Alcohol/week: 0.0 standard drinks  . Drug use: No  . Sexual activity: Not on file  Other Topics Concern  . Not on file  Social History Narrative   No living will   Request husband and daughter as medical decision makers   Would accept resuscitation   No prolonged tube feeds if cognitively unaware   Social Determinants of Health   Financial Resource Strain: Not on file  Food Insecurity: Not on  file  Transportation Needs: Not on file  Physical Activity: Not on file  Stress: Not on file  Social Connections: Not on file    Allergies: Allergies  Allergen Reactions  . Clindamycin/Lincomycin     Chest felt tight, breathing off  . Ampicillin   . Penicillins   . Trileptal [Oxcarbazepine] Rash    Outpatient Meds: Current Outpatient Medications  Medication Sig Dispense Refill  . albuterol (PROVENTIL HFA;VENTOLIN HFA) 108 (90 Base) MCG/ACT inhaler Inhale 2 puffs into the lungs every 6 (six) hours as needed. 8.5 g 11  . aspirin 81 MG tablet Take 81 mg by mouth daily.    . fexofenadine (ALLEGRA) 180 MG tablet Take 180 mg by mouth daily.     . fluticasone (FLONASE) 50 MCG/ACT nasal spray USE TWO SPRAYS IN EACH NOSTRIL ONCE DAILY. 48 g 3  . gabapentin (NEURONTIN) 300 MG capsule Take 1 capsule (300 mg total) by mouth 3 (three) times daily. 270 capsule 3  . meclizine (ANTIVERT) 25 MG tablet TAKE 1 TABLET BY MOUTH THREE TIMES A DAY AS NEEDED FOR DIZZINESS 270 tablet 0  . Polyethyl Glycol-Propyl Glycol 0.4-0.3 % SOLN Place 1 drop into both eyes as needed.    . traMADol (ULTRAM) 50 MG tablet Take 1 tablet (50 mg total) by mouth every 8 (eight) hours as needed. 30 tablet 0  . triamterene-hydrochlorothiazide (MAXZIDE-25) 37.5-25 MG tablet TAKE 1 TABLET EVERY DAY 90 tablet 3   No current facility-administered medications for this visit.      ___________________________________________________________________ Objective   Exam:  BP (!) 160/80 (BP Location: Left Arm, Patient Position: Sitting, Cuff Size: Normal)   Stout 96   Ht 5' 4.25" (1.632 m) Comment: height measured without shoes  Wt 238 lb 2 oz (108 kg)   BMI 40.56 kg/m  Wt Readings from Last 3 Encounters:  09/02/20 238 lb 2 oz (108 kg)  07/07/20 236 lb (107 kg)  04/02/20 225 lb 5 oz (102.2 kg)     General: Well-appearing  Eyes: sclera anicteric, no redness  ENT: oral mucosa moist without lesions, no cervical or supraclavicular lymphadenopathy  CV: RRR without murmur, S1/S2, no JVD, no peripheral edema  Resp: clear to auscultation bilaterally, normal RR and effort noted  GI: soft, no tenderness, with active bowel sounds. No guarding or palpable organomegaly noted.  Skin; warm and dry, no rash or jaundice noted  Neuro: awake, alert and oriented x 3. Normal gross motor function and fluent speech  Labs:  Positive FOBT on 07/14/2020 Negative FOBT January 2021, January 2020, January 2019, December 2017  CBC Latest Ref Rng & Units 07/07/2020 07/07/2019 07/05/2018  WBC 4.0 - 10.5 K/uL 5.4 5.0 4.5  Hemoglobin 12.0 - 15.0 g/dL 12.7 13.0 13.3  Hematocrit 36.0 -  46.0 % 39.1 39.4 40.0  Platelets 150.0 - 400.0 K/uL 213.0 214.0 206.0   CMP Latest Ref Rng & Units 07/07/2020 07/07/2019 07/05/2018  Glucose 70 - 99 mg/dL 70 97 92  BUN 6 - 23 mg/dL 19 13 15   Creatinine 0.40 - 1.20 mg/dL 1.04 1.07 0.97  Sodium 135 - 145 mEq/L 137 135 139  Potassium 3.5 - 5.1 mEq/L 3.4(L) 3.6 3.7  Chloride 96 - 112 mEq/L 100 99 100  CO2 19 - 32 mEq/L 28 29 28   Calcium 8.4 - 10.5 mg/dL 9.6 9.7 10.0  Total Protein 6.0 - 8.3 g/dL 8.0 8.2 8.0  Total Bilirubin 0.2 - 1.2 mg/dL 0.5 0.5 0.5  Alkaline Phos 39 - 117 U/L 68 79  73  AST 0 - 37 U/L 18 17 17   ALT 0 - 35 U/L 13 13 12    Assessment: Encounter Diagnosis  Name Primary?  . Heme positive stool Yes    No localizing GI symptoms or anemia. We discussed the possibility of a lower digestive source such as large polyp, malignancy or ulcer.  Unlikely upper digestive source without symptoms or anemia. Could be false positive as well.  I recommended she undergo colonoscopy for further evaluation.  She was agreeable after discussion of procedure and risks.  The benefits and risks of the planned procedure were described in detail with the patient or (when appropriate) their health care proxy.  Risks were outlined as including, but not limited to, bleeding, infection, perforation, adverse medication reaction leading to cardiac or pulmonary decompensation, pancreatitis (if ERCP).  The limitation of incomplete mucosal visualization was also discussed.  No guarantees or warranties were given.   Plan: Colonoscopy scheduled.  Thank you for the courtesy of this consult.  Please call me with any questions or concerns.  Nelida Meuse III  CC: Referring provider noted above

## 2020-09-06 ENCOUNTER — Other Ambulatory Visit: Payer: Self-pay | Admitting: Gastroenterology

## 2020-09-07 ENCOUNTER — Ambulatory Visit
Admission: RE | Admit: 2020-09-07 | Discharge: 2020-09-07 | Disposition: A | Discharge status: Home / Self Care | Payer: Medicare HMO | Source: Ambulatory Visit | Attending: Internal Medicine | Admitting: Internal Medicine

## 2020-09-07 ENCOUNTER — Other Ambulatory Visit: Payer: Self-pay

## 2020-09-07 ENCOUNTER — Telehealth: Payer: Self-pay | Admitting: Gastroenterology

## 2020-09-07 DIAGNOSIS — Z1231 Encounter for screening mammogram for malignant neoplasm of breast: Secondary | ICD-10-CM | POA: Insufficient documentation

## 2020-09-07 NOTE — Telephone Encounter (Signed)
Medicare Plenvu card activated and faxed to [facility]. BIN: [id number], PCNPryor Curia, GRP: [id number], ID: [id number]

## 2020-09-07 NOTE — Telephone Encounter (Signed)
Inbound call from patient stating her insurance does not cover Plenvu and is requesting alternate script be sent to pharmacy please.

## 2020-09-09 ENCOUNTER — Other Ambulatory Visit: Payer: Self-pay | Admitting: Gastroenterology

## 2020-09-09 ENCOUNTER — Other Ambulatory Visit: Payer: Self-pay

## 2020-09-09 MED ORDER — PLENVU 140 G PO SOLR: 140.0000 g | ORAL | 0 refills | Status: DC

## 2020-09-09 NOTE — Telephone Encounter (Signed)
Patient states she called pharmacy today again and the stated that it still was not covered.  She would like either another product or a sample.  Please call patient

## 2020-09-10 ENCOUNTER — Encounter: Payer: Self-pay | Admitting: Gastroenterology

## 2020-09-10 NOTE — Telephone Encounter (Signed)
Called the pharmacy. They ran the codes we sent and it is $60. They will notify the patient.

## 2020-09-14 ENCOUNTER — Encounter: Payer: Self-pay | Admitting: Gastroenterology

## 2020-09-14 ENCOUNTER — Ambulatory Visit (AMBULATORY_SURGERY_CENTER): Payer: Medicare HMO | Admitting: Gastroenterology

## 2020-09-14 ENCOUNTER — Other Ambulatory Visit: Payer: Self-pay

## 2020-09-14 VITALS — BP 108/73 | HR 77 | Temp 97.3°F | Resp 18 | Ht 64.0 in | Wt 238.0 lb

## 2020-09-14 DIAGNOSIS — R195 Other fecal abnormalities: Secondary | ICD-10-CM

## 2020-09-14 DIAGNOSIS — D122 Benign neoplasm of ascending colon: Secondary | ICD-10-CM | POA: Diagnosis not present

## 2020-09-14 DIAGNOSIS — D123 Benign neoplasm of transverse colon: Secondary | ICD-10-CM

## 2020-09-14 DIAGNOSIS — Z1211 Encounter for screening for malignant neoplasm of colon: Secondary | ICD-10-CM | POA: Diagnosis not present

## 2020-09-14 MED ORDER — SODIUM CHLORIDE 0.9 % IV SOLN: 500.0000 mL | Freq: Once | INTRAVENOUS | Status: DC

## 2020-09-14 NOTE — Patient Instructions (Signed)
Information on polyps given to you today.  Await pathology results.  Resume previous diet and medications.  YOU HAD AN ENDOSCOPIC PROCEDURE TODAY AT THE [facility]:   Refer to the procedure report that was given to you for any specific questions about what was found during the examination.  If the procedure report does not answer your questions, please call your gastroenterologist to clarify.  If you requested that your care partner not be given the details of your procedure findings, then the procedure report has been included in a sealed envelope for you to review at your convenience later.  YOU SHOULD EXPECT: Some feelings of bloating in the abdomen. Passage of more gas than usual.  Walking can help get rid of the air that was put into your GI tract during the procedure and reduce the bloating. If you had a lower endoscopy (such as a colonoscopy or flexible sigmoidoscopy) you may notice spotting of blood in your stool or on the toilet paper. If you underwent a bowel prep for your procedure, you may not have a normal bowel movement for a few days.  Please Note:  You might notice some irritation and congestion in your nose or some drainage.  This is from the oxygen used during your procedure.  There is no need for concern and it should clear up in a day or so.  SYMPTOMS TO REPORT IMMEDIATELY:   Following lower endoscopy (colonoscopy or flexible sigmoidoscopy):  Excessive amounts of blood in the stool  Significant tenderness or worsening of abdominal pains  Swelling of the abdomen that is new, acute  Fever of 100F or higher   For urgent or emergent issues, a gastroenterologist can be reached at any hour by calling [phone]. Do not use MyChart messaging for urgent concerns.    DIET:  We do recommend a small meal at first, but then you may proceed to your regular diet.  Drink plenty of fluids but you should avoid alcoholic beverages for 24 hours.  ACTIVITY:  You should  plan to take it easy for the rest of today and you should NOT DRIVE or use heavy machinery until tomorrow (because of the sedation medicines used during the test).    FOLLOW UP: Our staff will call the number listed on your records 48-72 hours following your procedure to check on you and address any questions or concerns that you may have regarding the information given to you following your procedure. If we do not reach you, we will leave a message.  We will attempt to reach you two times.  During this call, we will ask if you have developed any symptoms of COVID 19. If you develop any symptoms (ie: fever, flu-like symptoms, shortness of breath, cough etc.) before then, please call [phone].  If you test positive for Covid 19 in the 2 weeks post procedure, please call and report this information to us.    If any biopsies were taken you will be contacted by phone or by letter within the next 1-3 weeks.  Please call us at [phone] if you have not heard about the biopsies in 3 weeks.    SIGNATURES/CONFIDENTIALITY: You and/or your care partner have signed paperwork which will be entered into your electronic medical record.  These signatures attest to the fact that that the information above on your After Visit Summary has been reviewed and is understood.  Full responsibility of the confidentiality of this discharge information lies with you and/or your care-partner. 

## 2020-09-14 NOTE — Op Note (Signed)
[facility] Patient Name: Madison Stout Procedure Date: 09/14/2020 1:53 PM MRN: [id number] Endoscopist: Sherilyn Cooter L. Myrtie Neither , MD Age: 70 Referring MD:  Date of Birth: 10-21-1950 Gender: Female Account #: [id number] Procedure:                Colonoscopy Indications:              Heme positive stool (routine screening test,                            asymptomatic, normal hemoglobin) Medicines:                Monitored Anesthesia Care Procedure:                Pre-Anesthesia Assessment:                           - Prior to the procedure, a History and Physical                            was performed, and patient medications and                            allergies were reviewed. The patient's tolerance of                            previous anesthesia was also reviewed. The risks                            and benefits of the procedure and the sedation                            options and risks were discussed with the patient.                            All questions were answered, and informed consent                            was obtained. Prior Anticoagulants: The patient has                            taken no previous anticoagulant or antiplatelet                            agents. ASA Grade Assessment: III - A patient with                            severe systemic disease. After reviewing the risks                            and benefits, the patient was deemed in                            satisfactory condition to undergo the procedure.  After obtaining informed consent, the colonoscope                            was passed under direct vision. Throughout the                            procedure, the patient's blood pressure, pulse, and                            oxygen saturations were monitored continuously. The                            Olympus CF-HQ190 [id number]) Colonoscope was                            introduced through the anus and  advanced to the the                            cecum, identified by appendiceal orifice and                            ileocecal valve. The colonoscopy was performed                            without difficulty. The patient tolerated the                            procedure well. The quality of the bowel                            preparation was excellent. The ileocecal valve and                            rectum were photographed. Scope In: 2:15:56 PM Scope Out: 2:30:45 PM Scope Withdrawal Time: 0 hours 10 minutes 37 seconds  Total Procedure Duration: 0 hours 14 minutes 49 seconds  Findings:                 The perianal and digital rectal examinations were                            normal.                           Three sessile and semi-pedunculated polyps were                            found in the transverse colon and ascending colon.                            The polyps were 2 to 10 mm in size. These polyps                            were removed with a cold snare. Resection and  retrieval were complete.                           The exam was otherwise without abnormality on                            direct and retroflexion views. Complications:            No immediate complications. Estimated Blood Loss:     Estimated blood loss was minimal. Impression:               - Three 2 to 10 mm polyps in the transverse colon                            and in the ascending colon, removed with a cold                            snare. Resected and retrieved.                           - The examination was otherwise normal on direct                            and retroflexion views.                           Apparent false positive stool test. Recommendation:           - Patient has a contact number available for                            emergencies. The signs and symptoms of potential                            delayed complications were discussed with the                             patient. Return to normal activities tomorrow.                            Written discharge instructions were provided to the                            patient.                           - Resume previous diet.                           - Continue present medications.                           - Await pathology results.                           - Repeat colonoscopy is recommended for  surveillance. The colonoscopy date will be                            determined after pathology results from today's                            exam become available for review. Amoura Ransier L. Myrtie Neither, MD 09/14/2020 2:37:39 PM This report has been signed electronically.

## 2020-09-14 NOTE — Progress Notes (Signed)
CW vitlas and PH IV.

## 2020-09-14 NOTE — Progress Notes (Signed)
To PACU, VSS. Report to RN.tb 

## 2020-09-14 NOTE — Progress Notes (Signed)
Called to room to assist during endoscopic procedure.  Patient ID and intended procedure confirmed with present staff. Received instructions for my participation in the procedure from the performing physician.  

## 2020-09-16 ENCOUNTER — Telehealth: Payer: Self-pay | Admitting: *Deleted

## 2020-09-16 NOTE — Telephone Encounter (Signed)
  Follow up Call-  Call back number 09/14/2020  Post procedure Call Back phone  # [phone]  Permission to leave phone message Yes  Some recent data might be hidden     Patient questions:  Do you have a fever, pain , or abdominal swelling? No. Pain Score  0 *  Have you tolerated food without any problems? Yes.    Have you been able to return to your normal activities? Yes.    Do you have any questions about your discharge instructions: Diet   No. Medications  No. Follow up visit  No.  Do you have questions or concerns about your Care? No.  Actions: * If pain score is 4 or above: No action needed, pain <4.  1. Have you developed a fever since your procedure? no  2.   Have you had an respiratory symptoms (SOB or cough) since your procedure? no  3.   Have you tested positive for COVID 19 since your procedure no  4.   Have you had any family members/close contacts diagnosed with the COVID 19 since your procedure?  no   If yes to any of these questions please route to Joylene John, RN and Joella Prince, RN

## 2020-09-22 ENCOUNTER — Encounter: Payer: Self-pay | Admitting: Gastroenterology

## 2020-10-15 DIAGNOSIS — R519 Headache, unspecified: Secondary | ICD-10-CM | POA: Diagnosis not present

## 2020-10-15 DIAGNOSIS — G4453 Primary thunderclap headache: Secondary | ICD-10-CM | POA: Diagnosis not present

## 2020-10-29 DIAGNOSIS — R519 Headache, unspecified: Secondary | ICD-10-CM | POA: Diagnosis not present

## 2020-10-30 DIAGNOSIS — E049 Nontoxic goiter, unspecified: Secondary | ICD-10-CM | POA: Diagnosis not present

## 2020-10-30 DIAGNOSIS — R519 Headache, unspecified: Secondary | ICD-10-CM | POA: Diagnosis not present

## 2020-10-30 DIAGNOSIS — G4453 Primary thunderclap headache: Secondary | ICD-10-CM | POA: Diagnosis not present

## 2020-10-30 DIAGNOSIS — G500 Trigeminal neuralgia: Secondary | ICD-10-CM | POA: Diagnosis not present

## 2020-10-30 DIAGNOSIS — I7771 Dissection of carotid artery: Secondary | ICD-10-CM | POA: Diagnosis not present

## 2020-10-30 DIAGNOSIS — E042 Nontoxic multinodular goiter: Secondary | ICD-10-CM | POA: Diagnosis not present

## 2020-12-03 ENCOUNTER — Other Ambulatory Visit: Payer: Self-pay | Admitting: Internal Medicine

## 2020-12-15 DIAGNOSIS — H9202 Otalgia, left ear: Secondary | ICD-10-CM | POA: Diagnosis not present

## 2020-12-15 DIAGNOSIS — G4486 Cervicogenic headache: Secondary | ICD-10-CM | POA: Diagnosis not present

## 2020-12-15 DIAGNOSIS — R519 Headache, unspecified: Secondary | ICD-10-CM | POA: Diagnosis not present

## 2021-03-09 DIAGNOSIS — R519 Headache, unspecified: Secondary | ICD-10-CM | POA: Diagnosis not present

## 2021-03-09 DIAGNOSIS — M5481 Occipital neuralgia: Secondary | ICD-10-CM | POA: Diagnosis not present

## 2021-03-09 DIAGNOSIS — M25512 Pain in left shoulder: Secondary | ICD-10-CM | POA: Diagnosis not present

## 2021-03-31 DIAGNOSIS — H04123 Dry eye syndrome of bilateral lacrimal glands: Secondary | ICD-10-CM | POA: Diagnosis not present

## 2021-03-31 DIAGNOSIS — H2513 Age-related nuclear cataract, bilateral: Secondary | ICD-10-CM | POA: Diagnosis not present

## 2021-03-31 DIAGNOSIS — H43813 Vitreous degeneration, bilateral: Secondary | ICD-10-CM | POA: Diagnosis not present

## 2021-03-31 DIAGNOSIS — H25013 Cortical age-related cataract, bilateral: Secondary | ICD-10-CM | POA: Diagnosis not present

## 2021-04-14 DIAGNOSIS — R519 Headache, unspecified: Secondary | ICD-10-CM | POA: Diagnosis not present

## 2021-04-14 DIAGNOSIS — M542 Cervicalgia: Secondary | ICD-10-CM | POA: Diagnosis not present

## 2021-05-18 ENCOUNTER — Other Ambulatory Visit: Payer: Self-pay | Admitting: Internal Medicine

## 2021-06-10 DIAGNOSIS — G894 Chronic pain syndrome: Secondary | ICD-10-CM | POA: Diagnosis not present

## 2021-06-10 DIAGNOSIS — H6123 Impacted cerumen, bilateral: Secondary | ICD-10-CM | POA: Diagnosis not present

## 2021-06-10 DIAGNOSIS — M542 Cervicalgia: Secondary | ICD-10-CM | POA: Diagnosis not present

## 2021-06-10 DIAGNOSIS — R519 Headache, unspecified: Secondary | ICD-10-CM | POA: Diagnosis not present

## 2021-06-10 DIAGNOSIS — M47812 Spondylosis without myelopathy or radiculopathy, cervical region: Secondary | ICD-10-CM | POA: Diagnosis not present

## 2021-06-10 DIAGNOSIS — Z7182 Exercise counseling: Secondary | ICD-10-CM | POA: Diagnosis not present

## 2021-06-10 DIAGNOSIS — H90A31 Mixed conductive and sensorineural hearing loss, unilateral, right ear with restricted hearing on the contralateral side: Secondary | ICD-10-CM | POA: Diagnosis not present

## 2021-07-08 ENCOUNTER — Encounter: Payer: Medicare HMO | Admitting: Internal Medicine

## 2021-07-15 ENCOUNTER — Encounter: Payer: Self-pay | Admitting: Internal Medicine

## 2021-07-15 ENCOUNTER — Ambulatory Visit (INDEPENDENT_AMBULATORY_CARE_PROVIDER_SITE_OTHER): Payer: Medicare HMO | Admitting: Internal Medicine

## 2021-07-15 ENCOUNTER — Other Ambulatory Visit: Payer: Self-pay

## 2021-07-15 VITALS — BP 138/76 | HR 81 | Temp 97.9°F | Ht 64.5 in | Wt 239.0 lb

## 2021-07-15 DIAGNOSIS — I10 Essential (primary) hypertension: Secondary | ICD-10-CM

## 2021-07-15 DIAGNOSIS — L304 Erythema intertrigo: Secondary | ICD-10-CM

## 2021-07-15 DIAGNOSIS — J4520 Mild intermittent asthma, uncomplicated: Secondary | ICD-10-CM

## 2021-07-15 DIAGNOSIS — G500 Trigeminal neuralgia: Secondary | ICD-10-CM

## 2021-07-15 DIAGNOSIS — M159 Polyosteoarthritis, unspecified: Secondary | ICD-10-CM

## 2021-07-15 DIAGNOSIS — Z0000 Encounter for general adult medical examination without abnormal findings: Secondary | ICD-10-CM | POA: Diagnosis not present

## 2021-07-15 DIAGNOSIS — E049 Nontoxic goiter, unspecified: Secondary | ICD-10-CM | POA: Insufficient documentation

## 2021-07-15 LAB — COMPREHENSIVE METABOLIC PANEL
ALT: 14 U/L (ref 0–35)
AST: 17 U/L (ref 0–37)
Albumin: 4.4 g/dL (ref 3.5–5.2)
Alkaline Phosphatase: 69 U/L (ref 39–117)
BUN: 17 mg/dL (ref 6–23)
CO2: 26 mEq/L (ref 19–32)
Calcium: 9.7 mg/dL (ref 8.4–10.5)
Chloride: 101 mEq/L (ref 96–112)
Creatinine, Ser: 1 mg/dL (ref 0.40–1.20)
GFR: 57.78 mL/min — ABNORMAL LOW (ref >60.00)
Glucose, Bld: 88 mg/dL (ref 70–99)
Potassium: 3.9 mEq/L (ref 3.5–5.1)
Sodium: 138 mEq/L (ref 135–145)
Total Bilirubin: 0.6 mg/dL (ref 0.2–1.2)
Total Protein: 7.7 g/dL (ref 6.0–8.3)

## 2021-07-15 LAB — CBC
HCT: 39.5 % (ref 36.0–46.0)
Hemoglobin: 12.8 g/dL (ref 12.0–15.0)
MCHC: 32.3 g/dL (ref 30.0–36.0)
MCV: 76.9 fl — ABNORMAL LOW (ref 78.0–100.0)
Platelets: 218 10*3/uL (ref 150.0–400.0)
RBC: 5.14 Mil/uL — ABNORMAL HIGH (ref 3.87–5.11)
RDW: 15.7 % — ABNORMAL HIGH (ref 11.5–15.5)
WBC: 4.3 10*3/uL (ref 4.0–10.5)

## 2021-07-15 LAB — T4, FREE: Free T4: 0.72 ng/dL (ref 0.60–1.60)

## 2021-07-15 LAB — TSH: TSH: 1.58 u[IU]/mL (ref 0.35–5.50)

## 2021-07-15 MED ORDER — TRAMADOL HCL 50 MG PO TABS: 50.0000 mg | ORAL_TABLET | Freq: Three times a day (TID) | ORAL | 0 refills | Status: DC | PRN

## 2021-07-15 MED ORDER — FLUCONAZOLE 150 MG PO TABS: 150.0000 mg | ORAL_TABLET | ORAL | 1 refills | Status: DC

## 2021-07-15 NOTE — Assessment & Plan Note (Signed)
Seen on MRI Apparently just on right Will check labs--not nodules

## 2021-07-15 NOTE — Progress Notes (Signed)
Subjective:    Patient ID: Madison Stout, female    DOB: Oct 26, 1950, 71 y.o.   MRN: [id number]  HPI Here with husband for Medicare wellness visit and follow up of chronic health conditions Reviewed advanced directives Reviewed other doctors----Headache clinic in [place], Dr Danis--GI, Dr Tressia Danas, Dr Bertram [place], Dr Katha Cabal, Dr Lenis Noon No hospitalizations or surgery in the past year Vision is okay----cataracts worse but she doesn't notice Hearing is not great---may need hearing aides Not really exercising No falls No depression or anhedonia No alcohol or tobacco Independent with instrumental ADLs Mild memory problems ---mostly name recall  Still having the headaches---going to HA clinic in [place] with [facility] headache doctor---Sengupta (botox injections not helpful) Also still gets the trigeminal pain---gabapentin,tylenol  Had reaction to bivalent COVID vaccine Arm very swollen/itchy/red Arm still aches ---about a month ago  Asthma is quiet Albuterol prn only  Doesn't check BP Has had some upper substernal pains---"ache". Not with exertion No SOB with this-----just has DOE No palpitations No dizziness or syncope  Gained a little weight BMI 40  Still has generalized arthritis pain Rarely uses the tramadol--recently due to arm pain from reaction  Current Outpatient Medications on File Prior to Visit  Medication Sig Dispense Refill   albuterol (PROVENTIL HFA;VENTOLIN HFA) 108 (90 Base) MCG/ACT inhaler Inhale 2 puffs into the lungs every 6 (six) hours as needed. 8.5 g 11   aspirin 81 MG tablet Take 81 mg by mouth daily.     fexofenadine (ALLEGRA) 180 MG tablet Take 180 mg by mouth daily.     fluticasone (FLONASE) 50 MCG/ACT nasal spray USE TWO SPRAYS IN EACH NOSTRIL ONCE DAILY. 48 g 3   gabapentin (NEURONTIN) 300 MG capsule Take 1 capsule (300 mg total) by mouth 3 (three) times daily. 270 capsule 3   meclizine (ANTIVERT) 25 MG tablet  TAKE 1 TABLET BY MOUTH THREE TIMES A DAY AS NEEDED FOR DIZZINESS 270 tablet 0   Polyethyl Glycol-Propyl Glycol 0.4-0.3 % SOLN Place 1 drop into both eyes as needed.     traMADol (ULTRAM) 50 MG tablet Take 1 tablet (50 mg total) by mouth every 8 (eight) hours as needed. 30 tablet 0   triamterene-hydrochlorothiazide (MAXZIDE-25) 37.5-25 MG tablet TAKE 1 TABLET EVERY DAY 90 tablet 2   No current facility-administered medications on file prior to visit.    Allergies  Allergen Reactions   Clindamycin/Lincomycin     Chest felt tight, breathing off   Ampicillin    Penicillins    Trileptal [Oxcarbazepine] Rash    Past Medical History:  Diagnosis Date   Allergy    Asthma    Chronic headaches    Hypertension    Kidney stones    Pneumonia     Past Surgical History:  Procedure Laterality Date   CESAREAN SECTION     MYOMECTOMY  2005   removed    Family History  Problem Relation Age of Onset   Dementia Mother    Hypertension Mother    Diabetes Mother    Breast cancer Mother 39   Uterine cancer Mother    Heart disease Father    Hypertension Father    Asthma Sister    Uterine cancer Maternal Grandmother    Parkinson's disease Sister    Other Sister        died at birth   Hypertension Sister    Breast cancer Niece 64   Colon cancer Neg Hx    Rectal cancer Neg Hx  Stomach cancer Neg Hx    Esophageal cancer Neg Hx     Social History   Socioeconomic History   Marital status: Married    Spouse name: Not on file   Number of children: 1   Years of education: Not on file   Highest education level: Not on file  Occupational History   Occupation: Buyer, retail: [employer]    Comment: Retired   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  Tobacco Use   Smoking status: Never   Smokeless tobacco: Never  Vaping Use   Vaping Use: Never used  Substance and Sexual Activity   Alcohol use: No    Alcohol/week: 0.0 standard drinks   Drug use:  No   Sexual activity: Not on file  Other Topics Concern   Not on file  Social History Narrative   No living will   Request husband and daughter as medical decision makers   Would accept resuscitation   No prolonged tube feeds if cognitively unaware   Social Determinants of Health   Financial Resource Strain: Not on file  Food Insecurity: Not on file  Transportation Needs: Not on file  Physical Activity: Not on file  Stress: Not on file  Social Connections: Not on file  Intimate Partner Violence: Not on file   Review of Systems Appetite is okay Full dentures---needs to see dentist though (bone fragment in gum) Wears seat belt No suspicious skin lesions--but does have some rash under breasts (uses corn starch) No heartburn or dysphagia  Bowels move fine with stool softeners---generally No dysuria. Mild dribbling at times---wears pad occasionally Has regular low back pain to left leg. Tramadol helps (rare)    Objective:   Physical Exam Constitutional:      Appearance: Normal appearance.  HENT:     Mouth/Throat:     Comments: No lesions Eyes:     Conjunctiva/sclera: Conjunctivae normal.     Pupils: Pupils are equal, round, and reactive to light.  Neck:     Comments: Symmetric enlarged thyroid Cardiovascular:     Rate and Rhythm: Normal rate and regular rhythm.     Pulses: Normal pulses.     Heart sounds: No murmur heard. Pulmonary:     Effort: Pulmonary effort is normal.     Breath sounds: Normal breath sounds. No wheezing or rales.  Abdominal:     Palpations: Abdomen is soft.     Tenderness: There is no abdominal tenderness.  Musculoskeletal:     Cervical back: Neck supple.     Right lower leg: No edema.     Left lower leg: No edema.  Lymphadenopathy:     Cervical: No cervical adenopathy.  Skin:    Findings: No lesion or rash.  Neurological:     Mental Status: She is alert and oriented to person, place, and time.     Comments: Mini-cog normal  Psychiatric:         Mood and Affect: Mood normal.        Behavior: Behavior normal.           Assessment & Plan:

## 2021-07-15 NOTE — Assessment & Plan Note (Signed)
BMI still 40 Discussed DASH eating  Needs to exercise

## 2021-07-15 NOTE — Progress Notes (Signed)
Hearing Screening - Comments:: December 2022 Vision Screening - Comments:: September 2022

## 2021-07-15 NOTE — Assessment & Plan Note (Signed)
BP Readings from Last 3 Encounters:  07/15/21 138/76  09/14/20 108/73  09/02/20 (!) 160/80   Controlled on triamterene/HCTZ Will check labs

## 2021-07-15 NOTE — Assessment & Plan Note (Signed)
Discussed topical rx Fluconazole 150mg  weekly prn

## 2021-07-15 NOTE — Assessment & Plan Note (Signed)
I have personally reviewed the Medicare Annual Wellness questionnaire and have noted 1. The patient's medical and social history 2. Their use of alcohol, tobacco or illicit drugs 3. Their current medications and supplements 4. The patient's functional ability including ADL's, fall risks, home safety risks and hearing or visual             impairment. 5. Diet and physical activities 6. Evidence for depression or mood disorders  The patients weight, height, BMI and visual acuity have been recorded in the chart I have made referrals, counseling and provided education to the patient based review of the above and I have provided the pt with a written personalized care plan for preventive services.  I have provided you with a copy of your personalized plan for preventive services. Please take the time to review along with your updated medication list.  Had bivalent COVID and flu vaccines shingrix when covered Needs to exercise Colon due again 2025 Yearly mammogram---due in March

## 2021-07-15 NOTE — Assessment & Plan Note (Signed)
Ongoing pain and headaches Still on the gabapentin 300/600/900

## 2021-07-15 NOTE — Assessment & Plan Note (Signed)
Uses the tramadol rarely---will refill Tylenol intermittently also

## 2021-07-15 NOTE — Assessment & Plan Note (Signed)
Fairly quiet Rarely needs prn albuterol

## 2021-07-27 ENCOUNTER — Other Ambulatory Visit: Payer: Self-pay | Admitting: Internal Medicine

## 2021-07-27 DIAGNOSIS — Z1231 Encounter for screening mammogram for malignant neoplasm of breast: Secondary | ICD-10-CM

## 2021-08-03 DIAGNOSIS — Z79899 Other long term (current) drug therapy: Secondary | ICD-10-CM | POA: Diagnosis not present

## 2021-08-03 DIAGNOSIS — R519 Headache, unspecified: Secondary | ICD-10-CM | POA: Diagnosis not present

## 2021-08-03 DIAGNOSIS — G4452 New daily persistent headache (NDPH): Secondary | ICD-10-CM | POA: Diagnosis not present

## 2021-08-03 DIAGNOSIS — Z049 Encounter for examination and observation for unspecified reason: Secondary | ICD-10-CM | POA: Diagnosis not present

## 2021-08-03 DIAGNOSIS — G4451 Hemicrania continua: Secondary | ICD-10-CM | POA: Diagnosis not present

## 2021-08-03 DIAGNOSIS — G44099 Other trigeminal autonomic cephalgias (TAC), not intractable: Secondary | ICD-10-CM | POA: Diagnosis not present

## 2021-08-11 DIAGNOSIS — M791 Myalgia, unspecified site: Secondary | ICD-10-CM | POA: Diagnosis not present

## 2021-08-11 DIAGNOSIS — M542 Cervicalgia: Secondary | ICD-10-CM | POA: Diagnosis not present

## 2021-08-11 DIAGNOSIS — G4452 New daily persistent headache (NDPH): Secondary | ICD-10-CM | POA: Diagnosis not present

## 2021-08-11 DIAGNOSIS — G518 Other disorders of facial nerve: Secondary | ICD-10-CM | POA: Diagnosis not present

## 2021-08-11 DIAGNOSIS — R519 Headache, unspecified: Secondary | ICD-10-CM | POA: Diagnosis not present

## 2021-08-11 DIAGNOSIS — G44099 Other trigeminal autonomic cephalgias (TAC), not intractable: Secondary | ICD-10-CM | POA: Diagnosis not present

## 2021-08-11 DIAGNOSIS — G4451 Hemicrania continua: Secondary | ICD-10-CM | POA: Diagnosis not present

## 2021-08-31 DIAGNOSIS — G44099 Other trigeminal autonomic cephalgias (TAC), not intractable: Secondary | ICD-10-CM | POA: Diagnosis not present

## 2021-08-31 DIAGNOSIS — G4452 New daily persistent headache (NDPH): Secondary | ICD-10-CM | POA: Diagnosis not present

## 2021-08-31 DIAGNOSIS — G4451 Hemicrania continua: Secondary | ICD-10-CM | POA: Diagnosis not present

## 2021-08-31 DIAGNOSIS — M542 Cervicalgia: Secondary | ICD-10-CM | POA: Diagnosis not present

## 2021-08-31 DIAGNOSIS — G518 Other disorders of facial nerve: Secondary | ICD-10-CM | POA: Diagnosis not present

## 2021-08-31 DIAGNOSIS — M791 Myalgia, unspecified site: Secondary | ICD-10-CM | POA: Diagnosis not present

## 2021-09-14 ENCOUNTER — Other Ambulatory Visit: Payer: Self-pay

## 2021-09-14 ENCOUNTER — Ambulatory Visit
Admission: RE | Admit: 2021-09-14 | Discharge: 2021-09-14 | Disposition: A | Discharge status: Home / Self Care | Payer: Medicare HMO | Source: Ambulatory Visit | Attending: Internal Medicine | Admitting: Internal Medicine

## 2021-09-14 DIAGNOSIS — Z1231 Encounter for screening mammogram for malignant neoplasm of breast: Secondary | ICD-10-CM | POA: Diagnosis not present

## 2021-09-14 DIAGNOSIS — M542 Cervicalgia: Secondary | ICD-10-CM | POA: Diagnosis not present

## 2021-09-14 DIAGNOSIS — G4452 New daily persistent headache (NDPH): Secondary | ICD-10-CM | POA: Diagnosis not present

## 2021-09-14 DIAGNOSIS — G518 Other disorders of facial nerve: Secondary | ICD-10-CM | POA: Diagnosis not present

## 2021-09-14 DIAGNOSIS — G4451 Hemicrania continua: Secondary | ICD-10-CM | POA: Diagnosis not present

## 2021-09-14 DIAGNOSIS — M791 Myalgia, unspecified site: Secondary | ICD-10-CM | POA: Diagnosis not present

## 2021-09-14 DIAGNOSIS — G44099 Other trigeminal autonomic cephalgias (TAC), not intractable: Secondary | ICD-10-CM | POA: Diagnosis not present

## 2021-09-21 ENCOUNTER — Other Ambulatory Visit: Payer: Self-pay | Admitting: Internal Medicine

## 2021-10-06 DIAGNOSIS — G518 Other disorders of facial nerve: Secondary | ICD-10-CM | POA: Diagnosis not present

## 2021-10-06 DIAGNOSIS — G4451 Hemicrania continua: Secondary | ICD-10-CM | POA: Diagnosis not present

## 2021-10-06 DIAGNOSIS — M791 Myalgia, unspecified site: Secondary | ICD-10-CM | POA: Diagnosis not present

## 2021-10-06 DIAGNOSIS — G4452 New daily persistent headache (NDPH): Secondary | ICD-10-CM | POA: Diagnosis not present

## 2021-10-06 DIAGNOSIS — M542 Cervicalgia: Secondary | ICD-10-CM | POA: Diagnosis not present

## 2021-10-06 DIAGNOSIS — G44099 Other trigeminal autonomic cephalgias (TAC), not intractable: Secondary | ICD-10-CM | POA: Diagnosis not present

## 2021-10-26 DIAGNOSIS — G44099 Other trigeminal autonomic cephalgias (TAC), not intractable: Secondary | ICD-10-CM | POA: Diagnosis not present

## 2021-10-26 DIAGNOSIS — M542 Cervicalgia: Secondary | ICD-10-CM | POA: Diagnosis not present

## 2021-10-26 DIAGNOSIS — G518 Other disorders of facial nerve: Secondary | ICD-10-CM | POA: Diagnosis not present

## 2021-10-26 DIAGNOSIS — M791 Myalgia, unspecified site: Secondary | ICD-10-CM | POA: Diagnosis not present

## 2021-10-26 DIAGNOSIS — G4452 New daily persistent headache (NDPH): Secondary | ICD-10-CM | POA: Diagnosis not present

## 2021-10-26 DIAGNOSIS — G4451 Hemicrania continua: Secondary | ICD-10-CM | POA: Diagnosis not present

## 2021-11-09 DIAGNOSIS — M791 Myalgia, unspecified site: Secondary | ICD-10-CM | POA: Diagnosis not present

## 2021-11-09 DIAGNOSIS — G4452 New daily persistent headache (NDPH): Secondary | ICD-10-CM | POA: Diagnosis not present

## 2021-11-09 DIAGNOSIS — M542 Cervicalgia: Secondary | ICD-10-CM | POA: Diagnosis not present

## 2021-11-09 DIAGNOSIS — G518 Other disorders of facial nerve: Secondary | ICD-10-CM | POA: Diagnosis not present

## 2021-11-09 DIAGNOSIS — G4451 Hemicrania continua: Secondary | ICD-10-CM | POA: Diagnosis not present

## 2021-11-09 DIAGNOSIS — G44099 Other trigeminal autonomic cephalgias (TAC), not intractable: Secondary | ICD-10-CM | POA: Diagnosis not present

## 2021-12-07 ENCOUNTER — Other Ambulatory Visit: Payer: Self-pay | Admitting: Internal Medicine

## 2021-12-15 DIAGNOSIS — G4451 Hemicrania continua: Secondary | ICD-10-CM | POA: Diagnosis not present

## 2021-12-15 DIAGNOSIS — M542 Cervicalgia: Secondary | ICD-10-CM | POA: Diagnosis not present

## 2021-12-15 DIAGNOSIS — M791 Myalgia, unspecified site: Secondary | ICD-10-CM | POA: Diagnosis not present

## 2021-12-15 DIAGNOSIS — G518 Other disorders of facial nerve: Secondary | ICD-10-CM | POA: Diagnosis not present

## 2021-12-15 DIAGNOSIS — G4452 New daily persistent headache (NDPH): Secondary | ICD-10-CM | POA: Diagnosis not present

## 2021-12-15 DIAGNOSIS — G44099 Other trigeminal autonomic cephalgias (TAC), not intractable: Secondary | ICD-10-CM | POA: Diagnosis not present

## 2022-01-18 ENCOUNTER — Telehealth: Payer: Self-pay | Admitting: Internal Medicine

## 2022-01-18 DIAGNOSIS — G4452 New daily persistent headache (NDPH): Secondary | ICD-10-CM | POA: Diagnosis not present

## 2022-01-18 DIAGNOSIS — M542 Cervicalgia: Secondary | ICD-10-CM | POA: Diagnosis not present

## 2022-01-18 DIAGNOSIS — M791 Myalgia, unspecified site: Secondary | ICD-10-CM | POA: Diagnosis not present

## 2022-01-18 DIAGNOSIS — G518 Other disorders of facial nerve: Secondary | ICD-10-CM | POA: Diagnosis not present

## 2022-01-18 MED ORDER — MECLIZINE HCL 25 MG PO TABS: ORAL_TABLET | ORAL | 1 refills | Status: DC

## 2022-01-18 NOTE — Telephone Encounter (Signed)
Last filled in 2020. Will forward to Dr Silvio Pate for approval.

## 2022-01-18 NOTE — Telephone Encounter (Signed)
Prescription sent Just did #90 since not using that much

## 2022-01-18 NOTE — Addendum Note (Signed)
Addended by: Viviana Simpler I on: 01/18/2022 11:43 AM   Modules accepted: Orders

## 2022-01-18 NOTE — Telephone Encounter (Signed)
  Encourage patient to contact the pharmacy for refills or they can request refills through [facility]  Did the patient contact the pharmacy: No    LAST APPOINTMENT DATE: 07/15/2021  NEXT APPOINTMENT DATE: 07/21/2022  MEDICATION: meclizine (ANTIVERT) 25 MG tablet  Is the patient out of medication? Yes   PHARMACY: [place] [address], [place]  Let patient know to contact pharmacy at the end of the day to make sure medication is ready.  Please notify patient to allow 48-72 hours to process

## 2022-02-10 ENCOUNTER — Ambulatory Visit (INDEPENDENT_AMBULATORY_CARE_PROVIDER_SITE_OTHER): Payer: Medicare HMO | Admitting: Internal Medicine

## 2022-02-10 ENCOUNTER — Encounter: Payer: Self-pay | Admitting: Internal Medicine

## 2022-02-10 VITALS — BP 114/74 | HR 80 | Temp 96.5°F | Ht 64.5 in | Wt 219.0 lb

## 2022-02-10 DIAGNOSIS — H9203 Otalgia, bilateral: Secondary | ICD-10-CM

## 2022-02-10 DIAGNOSIS — R634 Abnormal weight loss: Secondary | ICD-10-CM

## 2022-02-10 DIAGNOSIS — N1831 Chronic kidney disease, stage 3a: Secondary | ICD-10-CM

## 2022-02-10 DIAGNOSIS — Z87448 Personal history of other diseases of urinary system: Secondary | ICD-10-CM | POA: Diagnosis not present

## 2022-02-10 DIAGNOSIS — I10 Essential (primary) hypertension: Secondary | ICD-10-CM | POA: Diagnosis not present

## 2022-02-10 LAB — RENAL FUNCTION PANEL
Albumin: 4.3 g/dL (ref 3.5–5.2)
BUN: 18 mg/dL (ref 6–23)
CO2: 28 mEq/L (ref 19–32)
Calcium: 9.6 mg/dL (ref 8.4–10.5)
Chloride: 100 mEq/L (ref 96–112)
Creatinine, Ser: 1.01 mg/dL (ref 0.40–1.20)
GFR: 56.86 mL/min — ABNORMAL LOW (ref >60.00)
Glucose, Bld: 102 mg/dL — ABNORMAL HIGH (ref 70–99)
Phosphorus: 3 mg/dL (ref 2.3–4.6)
Potassium: 3.6 mEq/L (ref 3.5–5.1)
Sodium: 136 mEq/L (ref 135–145)

## 2022-02-10 LAB — POC URINALSYSI DIPSTICK (AUTOMATED)
Bilirubin, UA: NEGATIVE
Glucose, UA: NEGATIVE
Ketones, UA: NEGATIVE
Nitrite, UA: NEGATIVE
Protein, UA: POSITIVE — AB
Spec Grav, UA: 1.02 (ref 1.010–1.025)
Urobilinogen, UA: 0.2 E.U./dL
pH, UA: 6 (ref 5.0–8.0)

## 2022-02-10 LAB — SEDIMENTATION RATE: Sed Rate: 50 mm/hr — ABNORMAL HIGH (ref 0–30)

## 2022-02-10 LAB — CBC
HCT: 39.6 % (ref 36.0–46.0)
Hemoglobin: 12.9 g/dL (ref 12.0–15.0)
MCHC: 32.6 g/dL (ref 30.0–36.0)
MCV: 77.1 fl — ABNORMAL LOW (ref 78.0–100.0)
Platelets: 193 10*3/uL (ref 150.0–400.0)
RBC: 5.14 Mil/uL — ABNORMAL HIGH (ref 3.87–5.11)
RDW: 15.1 % (ref 11.5–15.5)
WBC: 4.3 10*3/uL (ref 4.0–10.5)

## 2022-02-10 LAB — HEPATIC FUNCTION PANEL
ALT: 9 U/L (ref 0–35)
AST: 12 U/L (ref 0–37)
Albumin: 4.3 g/dL (ref 3.5–5.2)
Alkaline Phosphatase: 82 U/L (ref 39–117)
Bilirubin, Direct: 0.1 mg/dL (ref 0.0–0.3)
Total Bilirubin: 0.5 mg/dL (ref 0.2–1.2)
Total Protein: 7.5 g/dL (ref 6.0–8.3)

## 2022-02-10 LAB — TSH: TSH: 1.19 u[IU]/mL (ref 0.35–5.50)

## 2022-02-10 NOTE — Assessment & Plan Note (Signed)
Borderline low GFR that may be related to the HCTZ Will recheck given her symptoms

## 2022-02-10 NOTE — Assessment & Plan Note (Signed)
2+ leuks No glucose Not sure about infection--will just send culture

## 2022-02-10 NOTE — Patient Instructions (Signed)
Try off the zonisamide to see if that helps how you feel. If not better, I will probably have to see you again to decide what the next steps are.

## 2022-02-10 NOTE — Addendum Note (Signed)
Addended by: Pilar Grammes on: 02/10/2022 09:45 AM   Modules accepted: Orders

## 2022-02-10 NOTE — Assessment & Plan Note (Signed)
Seems related to loss of taste and appetite Could be from the zonisamide Urinary changes---will check urine and labs

## 2022-02-10 NOTE — Assessment & Plan Note (Signed)
Considerable cerumen---but slight views of TMs look okay Discussed flushing ears

## 2022-02-10 NOTE — Progress Notes (Signed)
Subjective:    Patient ID: Madison Stout, female    DOB: 1951/03/18, 71 y.o.   MRN: [id number]  HPI Here due to some concerns--especially about urine With husband  Urine is more dark yellow Going more No pain or blood Husband feels she isn't drinking enough  Ears are full And having some bad aching in past couple of days  Seeing headache doctor On baclofen and zonisamide --HA doctor doesn't think that is the problems "I just don't feel good" Food doesn't taste good and has lost appetite  Last GFR in high 50's  Current Outpatient Medications on File Prior to Visit  Medication Sig Dispense Refill   albuterol (PROVENTIL HFA;VENTOLIN HFA) 108 (90 Base) MCG/ACT inhaler Inhale 2 puffs into the lungs every 6 (six) hours as needed. 8.5 g 11   aspirin 81 MG tablet Take 81 mg by mouth daily.     baclofen (LIORESAL) 10 MG tablet Take 10 mg by mouth 2 (two) times daily as needed.     fexofenadine (ALLEGRA) 180 MG tablet Take 180 mg by mouth daily.     fluconazole (DIFLUCAN) 150 MG tablet TAKE 1 TABLET EVERY WEEK 12 tablet 1   fluticasone (FLONASE) 50 MCG/ACT nasal spray USE TWO SPRAYS IN EACH NOSTRIL ONCE DAILY. 48 g 3   meclizine (ANTIVERT) 25 MG tablet TAKE 1 TABLET BY MOUTH THREE TIMES A DAY AS NEEDED FOR DIZZINESS 90 tablet 1   Polyethyl Glycol-Propyl Glycol 0.4-0.3 % SOLN Place 1 drop into both eyes as needed.     traMADol (ULTRAM) 50 MG tablet Take 1 tablet (50 mg total) by mouth every 8 (eight) hours as needed. 30 tablet 0   triamterene-hydrochlorothiazide (MAXZIDE-25) 37.5-25 MG tablet TAKE 1 TABLET EVERY DAY 90 tablet 3   zonisamide (ZONEGRAN) 25 MG capsule Take 100 mg by mouth at bedtime.     No current facility-administered medications on file prior to visit.    Allergies  Allergen Reactions   Clindamycin/Lincomycin     Chest felt tight, breathing off   Ampicillin    Penicillins    Trileptal [Oxcarbazepine] Rash    Past Medical History:  Diagnosis Date   Allergy     Asthma    Chronic headaches    Hypertension    Kidney stones    Pneumonia     Past Surgical History:  Procedure Laterality Date   CESAREAN SECTION     MYOMECTOMY  2005   removed    Family History  Problem Relation Age of Onset   Dementia Mother    Hypertension Mother    Diabetes Mother    Breast cancer Mother 62   Uterine cancer Mother    Heart disease Father    Hypertension Father    Asthma Sister    Uterine cancer Maternal Grandmother    Parkinson's disease Sister    Other Sister        died at birth   Hypertension Sister    Breast cancer Niece 25   Colon cancer Neg Hx    Rectal cancer Neg Hx    Stomach cancer Neg Hx    Esophageal cancer Neg Hx     Social History   Socioeconomic History   Marital status: Married    Spouse name: Not on file   Number of children: 1   Years of education: Not on file   Highest education level: Not on file  Occupational History   Occupation: Buyer, 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  Tobacco Use   Smoking status: Never    Passive exposure: Never   Smokeless tobacco: Never  Vaping Use   Vaping Use: Never used  Substance and Sexual Activity   Alcohol use: No    Alcohol/week: 0.0 standard drinks of alcohol   Drug use: No   Sexual activity: Not on file  Other Topics Concern   Not on file  Social History Narrative   No living will   Request husband and daughter as medical decision makers   Would accept resuscitation   No prolonged tube feeds if cognitively unaware   Social Determinants of Health   Financial Resource Strain: Not on file  Food Insecurity: Not on file  Transportation Needs: Not on file  Physical Activity: Not on file  Stress: Not on file  Social Connections: Not on file  Intimate Partner Violence: Not on file   Review of Systems Some worsening of low back pain--tramadol helps (not that often) Has lost 20# in past  6 months Slight cough---will occasionally breathe heavy (when doing something) No chest pain No edema      Objective:   Physical Exam Constitutional:      Appearance: Normal appearance.  Cardiovascular:     Rate and Rhythm: Normal rate and regular rhythm.     Heart sounds: No murmur heard.    No gallop.  Pulmonary:     Effort: Pulmonary effort is normal.     Breath sounds: Normal breath sounds. No wheezing or rales.  Abdominal:     Palpations: Abdomen is soft.     Tenderness: There is no abdominal tenderness.  Musculoskeletal:     Cervical back: Neck supple.     Right lower leg: No edema.     Left lower leg: No edema.  Lymphadenopathy:     Cervical: No cervical adenopathy.  Neurological:     General: No focal deficit present.     Mental Status: She is alert.  Psychiatric:        Mood and Affect: Mood normal.        Behavior: Behavior normal.            Assessment & Plan:

## 2022-02-10 NOTE — Assessment & Plan Note (Signed)
BP Readings from Last 3 Encounters:  02/10/22 114/74  07/15/21 138/76  09/14/20 108/73   BP lower Unclear if HCTZ/trimaterene is causing problems in the summer Will check urine and labs

## 2022-03-02 DIAGNOSIS — M791 Myalgia, unspecified site: Secondary | ICD-10-CM | POA: Diagnosis not present

## 2022-03-02 DIAGNOSIS — G518 Other disorders of facial nerve: Secondary | ICD-10-CM | POA: Diagnosis not present

## 2022-03-02 DIAGNOSIS — M542 Cervicalgia: Secondary | ICD-10-CM | POA: Diagnosis not present

## 2022-03-02 DIAGNOSIS — G4452 New daily persistent headache (NDPH): Secondary | ICD-10-CM | POA: Diagnosis not present

## 2022-03-14 ENCOUNTER — Other Ambulatory Visit: Payer: Self-pay | Admitting: Internal Medicine

## 2022-03-30 DIAGNOSIS — H2513 Age-related nuclear cataract, bilateral: Secondary | ICD-10-CM | POA: Diagnosis not present

## 2022-03-30 DIAGNOSIS — Z0100 Encounter for examination of eyes and vision without abnormal findings: Secondary | ICD-10-CM | POA: Diagnosis not present

## 2022-04-06 DIAGNOSIS — G4452 New daily persistent headache (NDPH): Secondary | ICD-10-CM | POA: Diagnosis not present

## 2022-04-06 DIAGNOSIS — G518 Other disorders of facial nerve: Secondary | ICD-10-CM | POA: Diagnosis not present

## 2022-04-06 DIAGNOSIS — M791 Myalgia, unspecified site: Secondary | ICD-10-CM | POA: Diagnosis not present

## 2022-04-06 DIAGNOSIS — M542 Cervicalgia: Secondary | ICD-10-CM | POA: Diagnosis not present

## 2022-05-20 ENCOUNTER — Other Ambulatory Visit: Payer: Self-pay | Admitting: Internal Medicine

## 2022-05-24 DIAGNOSIS — M791 Myalgia, unspecified site: Secondary | ICD-10-CM | POA: Diagnosis not present

## 2022-05-24 DIAGNOSIS — G4452 New daily persistent headache (NDPH): Secondary | ICD-10-CM | POA: Diagnosis not present

## 2022-05-24 DIAGNOSIS — G518 Other disorders of facial nerve: Secondary | ICD-10-CM | POA: Diagnosis not present

## 2022-05-24 DIAGNOSIS — M542 Cervicalgia: Secondary | ICD-10-CM | POA: Diagnosis not present

## 2022-06-07 ENCOUNTER — Other Ambulatory Visit: Payer: Self-pay | Admitting: Internal Medicine

## 2022-06-07 DIAGNOSIS — H60543 Acute eczematoid otitis externa, bilateral: Secondary | ICD-10-CM | POA: Diagnosis not present

## 2022-06-07 DIAGNOSIS — H6123 Impacted cerumen, bilateral: Secondary | ICD-10-CM | POA: Diagnosis not present

## 2022-06-28 DIAGNOSIS — H9201 Otalgia, right ear: Secondary | ICD-10-CM | POA: Diagnosis not present

## 2022-06-28 DIAGNOSIS — H60541 Acute eczematoid otitis externa, right ear: Secondary | ICD-10-CM | POA: Diagnosis not present

## 2022-07-04 ENCOUNTER — Other Ambulatory Visit: Payer: Self-pay | Admitting: Internal Medicine

## 2022-07-12 DIAGNOSIS — M791 Myalgia, unspecified site: Secondary | ICD-10-CM | POA: Diagnosis not present

## 2022-07-12 DIAGNOSIS — M542 Cervicalgia: Secondary | ICD-10-CM | POA: Diagnosis not present

## 2022-07-12 DIAGNOSIS — G518 Other disorders of facial nerve: Secondary | ICD-10-CM | POA: Diagnosis not present

## 2022-07-12 DIAGNOSIS — G4452 New daily persistent headache (NDPH): Secondary | ICD-10-CM | POA: Diagnosis not present

## 2022-07-17 ENCOUNTER — Other Ambulatory Visit: Payer: Self-pay | Admitting: Internal Medicine

## 2022-07-21 ENCOUNTER — Encounter: Payer: Medicare HMO | Admitting: Internal Medicine

## 2022-07-26 ENCOUNTER — Ambulatory Visit (INDEPENDENT_AMBULATORY_CARE_PROVIDER_SITE_OTHER): Payer: Medicare HMO | Admitting: Internal Medicine

## 2022-07-26 ENCOUNTER — Encounter: Payer: Self-pay | Admitting: Internal Medicine

## 2022-07-26 ENCOUNTER — Ambulatory Visit (INDEPENDENT_AMBULATORY_CARE_PROVIDER_SITE_OTHER)
Admission: RE | Admit: 2022-07-26 | Discharge: 2022-07-26 | Disposition: A | Discharge status: Home / Self Care | Payer: Medicare HMO | Source: Ambulatory Visit | Attending: Internal Medicine | Admitting: Internal Medicine

## 2022-07-26 VITALS — BP 138/70 | HR 92 | Temp 97.3°F | Ht 64.5 in | Wt 216.0 lb

## 2022-07-26 DIAGNOSIS — Z0000 Encounter for general adult medical examination without abnormal findings: Secondary | ICD-10-CM | POA: Diagnosis not present

## 2022-07-26 DIAGNOSIS — J4520 Mild intermittent asthma, uncomplicated: Secondary | ICD-10-CM

## 2022-07-26 DIAGNOSIS — E049 Nontoxic goiter, unspecified: Secondary | ICD-10-CM | POA: Diagnosis not present

## 2022-07-26 DIAGNOSIS — I10 Essential (primary) hypertension: Secondary | ICD-10-CM

## 2022-07-26 DIAGNOSIS — R079 Chest pain, unspecified: Secondary | ICD-10-CM | POA: Diagnosis not present

## 2022-07-26 DIAGNOSIS — R059 Cough, unspecified: Secondary | ICD-10-CM | POA: Diagnosis not present

## 2022-07-26 LAB — CBC
HCT: 40.8 % (ref 36.0–46.0)
Hemoglobin: 13.5 g/dL (ref 12.0–15.0)
MCHC: 33.2 g/dL (ref 30.0–36.0)
MCV: 78.4 fl (ref 78.0–100.0)
Platelets: 228 10*3/uL (ref 150.0–400.0)
RBC: 5.2 Mil/uL — ABNORMAL HIGH (ref 3.87–5.11)
RDW: 14.8 % (ref 11.5–15.5)
WBC: 5.9 10*3/uL (ref 4.0–10.5)

## 2022-07-26 LAB — COMPREHENSIVE METABOLIC PANEL
ALT: 10 U/L (ref 0–35)
AST: 13 U/L (ref 0–37)
Albumin: 4.5 g/dL (ref 3.5–5.2)
Alkaline Phosphatase: 88 U/L (ref 39–117)
BUN: 19 mg/dL (ref 6–23)
CO2: 28 mEq/L (ref 19–32)
Calcium: 10 mg/dL (ref 8.4–10.5)
Chloride: 100 mEq/L (ref 96–112)
Creatinine, Ser: 1.06 mg/dL (ref 0.40–1.20)
GFR: 53.49 mL/min — ABNORMAL LOW (ref >60.00)
Glucose, Bld: 89 mg/dL (ref 70–99)
Potassium: 3.5 mEq/L (ref 3.5–5.1)
Sodium: 136 mEq/L (ref 135–145)
Total Bilirubin: 0.4 mg/dL (ref 0.2–1.2)
Total Protein: 8 g/dL (ref 6.0–8.3)

## 2022-07-26 LAB — LIPID PANEL
Cholesterol: 204 mg/dL — ABNORMAL HIGH (ref 0–200)
HDL: 57.9 mg/dL (ref >39.00)
LDL Cholesterol: 114 mg/dL — ABNORMAL HIGH (ref 0–99)
NonHDL: 145.79
Total CHOL/HDL Ratio: 4
Triglycerides: 160 mg/dL — ABNORMAL HIGH (ref 0.0–149.0)
VLDL: 32 mg/dL (ref 0.0–40.0)

## 2022-07-26 LAB — TSH: TSH: 1.45 u[IU]/mL (ref 0.35–5.50)

## 2022-07-26 MED ORDER — TRIAMTERENE-HCTZ 37.5-25 MG PO TABS: 1.0000 | ORAL_TABLET | Freq: Every day | ORAL | 3 refills | Status: DC

## 2022-07-26 MED ORDER — MONTELUKAST SODIUM 10 MG PO TABS
10.0000 mg | ORAL_TABLET | Freq: Every day | ORAL | 3 refills | Status: DC
Start: 2022-07-26 — End: 2023-05-21

## 2022-07-26 MED ORDER — ALBUTEROL SULFATE HFA 108 (90 BASE) MCG/ACT IN AERS: 2.0000 | INHALATION_SPRAY | Freq: Four times a day (QID) | RESPIRATORY_TRACT | 5 refills | Status: DC | PRN

## 2022-07-26 NOTE — Progress Notes (Signed)
Hearing Screening - Comments:: December 2023 Vision Screening - Comments:: September 2023

## 2022-07-26 NOTE — Progress Notes (Signed)
Subjective:    Patient ID: Madison Stout, female    DOB: [birth year], 72 y.o.   MRN: [id number]  HPI Here with husband for Medicare wellness visit and follow up of chronic health conditions Reviewed advanced directives Reviewed other doctors---Dr Mitzi Davenport, Dr Barbee Cough, Dr Jonna Clark, Dr Erick Alley No hospitalizations or surgery in the past year Vision is okay Hearing okay No alcohol or tobacco No exercise --discussed  No falls No depression or anhedonia--but wishes she could get out and work again Independent with instrumental ADLs Mild memory issues ---not worrisome  Some chest and back pain Some cough in AM and night Aching pain in chest--better with Vicks Has used the albuterol inhaler --seems to help the chest pain No heartburn or dysphagia Gets some SOB---like working outside or Automotive engineer. Brief rest and resuming activity No dizziness or syncope No edema No palpitations  Reviewed weight Has lost 20# since last year--no particular effort Not eating as much GFR down to 36  Last GFR 56 Urine very yellow--no vitamins Tries to hydrate well  Continues with headache specialist Getting injections (?botox) Off the gabapentin--not as much trouble with trigeminal neuralgia  Current Outpatient Medications on File Prior to Visit  Medication Sig Dispense Refill   albuterol (PROVENTIL HFA;VENTOLIN HFA) 108 (90 Base) MCG/ACT inhaler Inhale 2 puffs into the lungs every 6 (six) hours as needed. 8.5 g 11   aspirin 81 MG tablet Take 81 mg by mouth daily.     baclofen (LIORESAL) 10 MG tablet Take 10 mg by mouth 2 (two) times daily as needed.     ciprofloxacin-dexamethasone (CIPRODEX) OTIC suspension 4 drops 2 (two) times daily.     fexofenadine (ALLEGRA) 180 MG tablet Take 180 mg by mouth daily.     fluconazole (DIFLUCAN) 150 MG tablet TAKE 1 TABLET EVERY WEEK 12 tablet 3   fluticasone (FLONASE) 50 MCG/ACT nasal spray USE TWO SPRAYS IN EACH NOSTRIL ONCE DAILY.  48 g 10   meclizine (ANTIVERT) 25 MG tablet TAKE 1 TABLET THREE TIMES DAILY AS NEEDED FOR DIZZINESS 100 tablet 0   Polyethyl Glycol-Propyl Glycol 0.4-0.3 % SOLN Place 1 drop into both eyes as needed.     traMADol (ULTRAM) 50 MG tablet Take 1 tablet (50 mg total) by mouth every 8 (eight) hours as needed. 30 tablet 0   triamterene-hydrochlorothiazide (MAXZIDE-25) 37.5-25 MG tablet TAKE 1 TABLET EVERY DAY 90 tablet 3   zonisamide (ZONEGRAN) 25 MG capsule Take 100 mg by mouth at bedtime.     No current facility-administered medications on file prior to visit.    Allergies  Allergen Reactions   Clindamycin/Lincomycin     Chest felt tight, breathing off   Ampicillin    Penicillins    Trileptal [Oxcarbazepine] Rash    Past Medical History:  Diagnosis Date   Allergy    Asthma    Chronic headaches    Hypertension    Kidney stones    Pneumonia     Past Surgical History:  Procedure Laterality Date   CESAREAN SECTION     MYOMECTOMY  2005   removed    Family History  Problem Relation Age of Onset   Dementia Mother    Hypertension Mother    Diabetes Mother    Breast cancer Mother 11   Uterine cancer Mother    Heart disease Father    Hypertension Father    Cancer Sister    Asthma Sister    Parkinson's disease Sister    Other Sister  died at birth   Hypertension Sister    Uterine cancer Maternal Grandmother    Breast cancer Niece 96   Colon cancer Neg Hx    Rectal cancer Neg Hx    Stomach cancer Neg Hx    Esophageal cancer Neg Hx     Social History   Socioeconomic History   Marital status: Married    Spouse name: Not on file   Number of children: 1   Years of education: Not on file   Highest education level: Not on file  Occupational History   Occupation: Buyer, retail: [employer]    Comment: Retired   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  Tobacco Use   Smoking status: Never    Passive exposure: Never   Smokeless  tobacco: Never  Vaping Use   Vaping Use: Never used  Substance and Sexual Activity   Alcohol use: No    Alcohol/week: 0.0 standard drinks of alcohol   Drug use: No   Sexual activity: Not on file  Other Topics Concern   Not on file  Social History Narrative   No living will   Request husband and daughter as medical decision makers   Would accept resuscitation   No prolonged tube feeds if cognitively unaware   Social Determinants of Health   Financial Resource Strain: Not on file  Food Insecurity: Not on file  Transportation Needs: Not on file  Physical Activity: Not on file  Stress: Not on file  Social Connections: Not on file  Intimate Partner Violence: Not on file   Review of Systems Sleeps fair Wears seat belt Full dentures--no dentist No suspicious skin lesions--no regular derm visits Bowels move fine--no blood No other urinary problems---some increased frequency at times. Some urgency Some back pain--lower at times. Uses rub or rare tramadol     Objective:   Physical Exam Constitutional:      Appearance: Normal appearance.  HENT:     Mouth/Throat:     Comments: No lesions Eyes:     Conjunctiva/sclera: Conjunctivae normal.     Pupils: Pupils are equal, round, and reactive to light.  Cardiovascular:     Rate and Rhythm: Normal rate and regular rhythm.     Pulses: Normal pulses.     Heart sounds: No murmur heard.    No gallop.  Pulmonary:     Effort: Pulmonary effort is normal.     Breath sounds: Normal breath sounds. No wheezing or rales.  Abdominal:     Palpations: Abdomen is soft.     Tenderness: There is no abdominal tenderness.  Musculoskeletal:     Cervical back: Neck supple.     Right lower leg: No edema.     Left lower leg: No edema.  Lymphadenopathy:     Cervical: No cervical adenopathy.  Skin:    Findings: No lesion or rash.  Neurological:     General: No focal deficit present.     Mental Status: She is alert and oriented to person, place,  and time.     Comments: Word naming 10/1 minute Recall 3/3  Psychiatric:        Mood and Affect: Mood normal.        Behavior: Behavior normal.            Assessment & Plan:

## 2022-07-26 NOTE — Assessment & Plan Note (Signed)
BP Readings from Last 3 Encounters:  07/26/22 138/70  02/10/22 114/74  07/15/21 138/76   Controlled on maxzide Will check labs

## 2022-07-26 NOTE — Assessment & Plan Note (Addendum)
With chest pain I suspect this is asthma related Will check CXR---looks normal. Will await radiology overread

## 2022-07-26 NOTE — Assessment & Plan Note (Signed)
BMI down to 36 with weight loss Has HTN, asthma, HA, arthritis Is better with eating now

## 2022-07-26 NOTE — Assessment & Plan Note (Signed)
I have personally reviewed the Medicare Annual Wellness questionnaire and have noted 1. The patient's medical and social history 2. Their use of alcohol, tobacco or illicit drugs 3. Their current medications and supplements 4. The patient's functional ability including ADL's, fall risks, home safety risks and hearing or visual             impairment. 5. Diet and physical activities 6. Evidence for depression or mood disorders  The patients weight, height, BMI and visual acuity have been recorded in the chart I have made referrals, counseling and provided education to the patient based review of the above and I have provided the pt with a written personalized care plan for preventive services.  I have provided you with a copy of your personalized plan for preventive services. Please take the time to review along with your updated medication list.  Colon due next year Mammogram every 1-2 years--at least till 40 (had 3/23) No pap due to age Discussed exercise again UTD on imms for this year

## 2022-07-26 NOTE — Assessment & Plan Note (Signed)
If CXR fine---will plan trial of montelukast for this and her allergies

## 2022-07-26 NOTE — Assessment & Plan Note (Signed)
No nodules on imaging Will check TSH

## 2022-08-11 DIAGNOSIS — L089 Local infection of the skin and subcutaneous tissue, unspecified: Secondary | ICD-10-CM | POA: Diagnosis not present

## 2022-08-11 DIAGNOSIS — Z6835 Body mass index (BMI) 35.0-35.9, adult: Secondary | ICD-10-CM | POA: Diagnosis not present

## 2022-08-15 ENCOUNTER — Other Ambulatory Visit: Payer: Self-pay | Admitting: Internal Medicine

## 2022-08-31 DIAGNOSIS — G4452 New daily persistent headache (NDPH): Secondary | ICD-10-CM | POA: Diagnosis not present

## 2022-08-31 DIAGNOSIS — M542 Cervicalgia: Secondary | ICD-10-CM | POA: Diagnosis not present

## 2022-08-31 DIAGNOSIS — G518 Other disorders of facial nerve: Secondary | ICD-10-CM | POA: Diagnosis not present

## 2022-08-31 DIAGNOSIS — M791 Myalgia, unspecified site: Secondary | ICD-10-CM | POA: Diagnosis not present

## 2022-09-08 ENCOUNTER — Ambulatory Visit (INDEPENDENT_AMBULATORY_CARE_PROVIDER_SITE_OTHER): Payer: Medicare HMO | Admitting: Internal Medicine

## 2022-09-08 ENCOUNTER — Encounter: Payer: Self-pay | Admitting: Internal Medicine

## 2022-09-08 VITALS — BP 132/72 | HR 52 | Temp 97.6°F | Ht 64.5 in | Wt 215.0 lb

## 2022-09-08 DIAGNOSIS — M79675 Pain in left toe(s): Secondary | ICD-10-CM | POA: Diagnosis not present

## 2022-09-08 LAB — URIC ACID: Uric Acid, Serum: 6.7 mg/dL (ref 2.4–7.0)

## 2022-09-08 MED ORDER — COLCHICINE 0.6 MG PO TABS: 0.6000 mg | ORAL_TABLET | Freq: Two times a day (BID) | ORAL | 0 refills | Status: DC | PRN

## 2022-09-08 MED ORDER — PREDNISONE 20 MG PO TABS: 40.0000 mg | ORAL_TABLET | Freq: Every day | ORAL | 0 refills | Status: DC

## 2022-09-08 NOTE — Progress Notes (Signed)
Subjective:    Patient ID: Madison Stout, female    DOB: 17-Oct-1950, 72 y.o.   MRN: [id number]  HPI Here due to left 2nd toe pain With husband  Noticed a problem with the toe over a month ago No trauma Swelling--turns purple and painful Tried soaks/alcohol/epsom salts Went to urgent care ---got bactrim (improved but then recurred this week)  Current Outpatient Medications on File Prior to Visit  Medication Sig Dispense Refill   albuterol (VENTOLIN HFA) 108 (90 Base) MCG/ACT inhaler Inhale 2 puffs into the lungs every 6 (six) hours as needed. 16 g 5   aspirin 81 MG tablet Take 81 mg by mouth daily.     baclofen (LIORESAL) 10 MG tablet Take 10 mg by mouth 2 (two) times daily as needed.     fexofenadine (ALLEGRA) 180 MG tablet Take 180 mg by mouth daily.     fluconazole (DIFLUCAN) 150 MG tablet TAKE 1 TABLET EVERY WEEK 12 tablet 3   fluticasone (FLONASE) 50 MCG/ACT nasal spray USE TWO SPRAYS IN EACH NOSTRIL ONCE DAILY. 48 g 10   meclizine (ANTIVERT) 25 MG tablet TAKE 1 TABLET THREE TIMES DAILY AS NEEDED FOR DIZZINESS 100 tablet 3   montelukast (SINGULAIR) 10 MG tablet Take 1 tablet (10 mg total) by mouth at bedtime. 90 tablet 3   Polyethyl Glycol-Propyl Glycol 0.4-0.3 % SOLN Place 1 drop into both eyes as needed.     traMADol (ULTRAM) 50 MG tablet Take 1 tablet (50 mg total) by mouth every 8 (eight) hours as needed. 30 tablet 0   triamterene-hydrochlorothiazide (MAXZIDE-25) 37.5-25 MG tablet Take 1 tablet by mouth daily. 90 tablet 3   zonisamide (ZONEGRAN) 25 MG capsule Take 100 mg by mouth at bedtime.     ciprofloxacin-dexamethasone (CIPRODEX) OTIC suspension 4 drops 2 (two) times daily. (Patient not taking: Reported on 09/08/2022)     No current facility-administered medications on file prior to visit.    Allergies  Allergen Reactions   Clindamycin/Lincomycin     Chest felt tight, breathing off   Ampicillin    Penicillins    Trileptal [Oxcarbazepine] Rash    Past Medical  History:  Diagnosis Date   Allergy    Asthma    Chronic headaches    Hypertension    Kidney stones    Pneumonia     Past Surgical History:  Procedure Laterality Date   CESAREAN SECTION     MYOMECTOMY  2005   removed    Family History  Problem Relation Age of Onset   Dementia Mother    Hypertension Mother    Diabetes Mother    Breast cancer Mother 21   Uterine cancer Mother    Heart disease Father    Hypertension Father    Cancer Sister    Asthma Sister    Parkinson's disease Sister    Other Sister        died at birth   Hypertension Sister    Uterine cancer Maternal Grandmother    Breast cancer Niece 67   Colon cancer Neg Hx    Rectal cancer Neg Hx    Stomach cancer Neg Hx    Esophageal cancer Neg Hx     Social History   Socioeconomic History   Marital status: Married    Spouse name: Not on file   Number of children: 1   Years of education: Not on file   Highest education level: Not on file  Occupational History   Occupation: Librarian, academic  Employer: [id number]    Comment: Retired   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  Tobacco Use   Smoking status: Never    Passive exposure: Never   Smokeless tobacco: Never  Vaping Use   Vaping Use: Never used  Substance and Sexual Activity   Alcohol use: No    Alcohol/week: 0.0 standard drinks of alcohol   Drug use: No   Sexual activity: Not on file  Other Topics Concern   Not on file  Social History Narrative   No living will   Request husband and daughter as medical decision makers   Would accept resuscitation   No prolonged tube feeds if cognitively unaware   Social Determinants of Health   Financial Resource Strain: Not on file  Food Insecurity: Not on file  Transportation Needs: Not on file  Physical Activity: Not on file  Stress: Not on file  Social Connections: Not on file  Intimate Partner Violence: Not on file   Review of Systems  No fever No history of  gout     Objective:   Physical Exam Musculoskeletal:     Comments: Isolated dark purple discoloration in just left 2nd toe over DIP Nail appears normal and no clear infection Some desquamation over the top            Assessment & Plan:

## 2022-09-08 NOTE — Assessment & Plan Note (Signed)
An unusual location but features are otherwise suggestive of gout---over the PIP with desquamation  Also sensitive with the covers Inflamed but doesn't look infected  Will try colchicine and give Rx for prednisone just in case If gets more inflamed--would try doxycycline

## 2022-09-08 NOTE — Patient Instructions (Signed)
Please try the colchicine 2 tabs today (like 1-2 hours apart) and again tomorrow. If the toe is not better, then take the prednisone

## 2022-09-10 ENCOUNTER — Encounter: Payer: Self-pay | Admitting: Internal Medicine

## 2022-09-11 ENCOUNTER — Other Ambulatory Visit: Payer: Self-pay | Admitting: Internal Medicine

## 2022-09-11 DIAGNOSIS — Z1231 Encounter for screening mammogram for malignant neoplasm of breast: Secondary | ICD-10-CM

## 2022-09-27 ENCOUNTER — Ambulatory Visit
Admission: RE | Admit: 2022-09-27 | Discharge: 2022-09-27 | Disposition: A | Discharge status: Home / Self Care | Payer: Medicare HMO | Source: Ambulatory Visit | Attending: Internal Medicine | Admitting: Internal Medicine

## 2022-09-27 DIAGNOSIS — Z1231 Encounter for screening mammogram for malignant neoplasm of breast: Secondary | ICD-10-CM | POA: Insufficient documentation

## 2022-10-12 DIAGNOSIS — G4452 New daily persistent headache (NDPH): Secondary | ICD-10-CM | POA: Diagnosis not present

## 2022-10-12 DIAGNOSIS — G518 Other disorders of facial nerve: Secondary | ICD-10-CM | POA: Diagnosis not present

## 2022-10-12 DIAGNOSIS — M791 Myalgia, unspecified site: Secondary | ICD-10-CM | POA: Diagnosis not present

## 2022-10-12 DIAGNOSIS — M542 Cervicalgia: Secondary | ICD-10-CM | POA: Diagnosis not present

## 2022-11-20 ENCOUNTER — Ambulatory Visit (INDEPENDENT_AMBULATORY_CARE_PROVIDER_SITE_OTHER): Payer: Medicare HMO | Admitting: Internal Medicine

## 2022-11-20 ENCOUNTER — Encounter: Payer: Self-pay | Admitting: Internal Medicine

## 2022-11-20 VITALS — BP 132/74 | HR 80 | Temp 97.2°F | Ht 64.5 in | Wt 214.0 lb

## 2022-11-20 DIAGNOSIS — M25552 Pain in left hip: Secondary | ICD-10-CM | POA: Diagnosis not present

## 2022-11-20 NOTE — Progress Notes (Signed)
Subjective:    Patient ID: Madison Stout, female    DOB: [birth year], 72 y.o.   MRN: [id number]  HPI Here due to groin pain With husband  Started with pain in both groins-- 5 days ago After seafood--so thought it could be gout (but colchicine didn't help) Now just on left side Okay sitting but worsens with walking Some low back pain also  No known injury No other Rx--didn't want to take tylenol due to her headache  Current Outpatient Medications on File Prior to Visit  Medication Sig Dispense Refill   albuterol (VENTOLIN HFA) 108 (90 Base) MCG/ACT inhaler Inhale 2 puffs into the lungs every 6 (six) hours as needed. 16 g 5   aspirin 81 MG tablet Take 81 mg by mouth daily.     baclofen (LIORESAL) 10 MG tablet Take 10 mg by mouth 2 (two) times daily as needed.     colchicine 0.6 MG tablet Take 1 tablet (0.6 mg total) by mouth 2 (two) times daily as needed. 60 tablet 0   fexofenadine (ALLEGRA) 180 MG tablet Take 180 mg by mouth daily.     fluconazole (DIFLUCAN) 150 MG tablet TAKE 1 TABLET EVERY WEEK 12 tablet 3   fluticasone (FLONASE) 50 MCG/ACT nasal spray USE TWO SPRAYS IN EACH NOSTRIL ONCE DAILY. 48 g 10   meclizine (ANTIVERT) 25 MG tablet TAKE 1 TABLET THREE TIMES DAILY AS NEEDED FOR DIZZINESS 100 tablet 3   montelukast (SINGULAIR) 10 MG tablet Take 1 tablet (10 mg total) by mouth at bedtime. 90 tablet 3   Polyethyl Glycol-Propyl Glycol 0.4-0.3 % SOLN Place 1 drop into both eyes as needed.     traMADol (ULTRAM) 50 MG tablet Take 1 tablet (50 mg total) by mouth every 8 (eight) hours as needed. 30 tablet 0   triamterene-hydrochlorothiazide (MAXZIDE-25) 37.5-25 MG tablet Take 1 tablet by mouth daily. 90 tablet 3   zonisamide (ZONEGRAN) 25 MG capsule Take 100 mg by mouth at bedtime.     No current facility-administered medications on file prior to visit.    Allergies  Allergen Reactions   Clindamycin/Lincomycin     Chest felt tight, breathing off   Ampicillin    Penicillins     Trileptal [Oxcarbazepine] Rash    Past Medical History:  Diagnosis Date   Allergy    Asthma    Chronic headaches    Hypertension    Kidney stones    Pneumonia     Past Surgical History:  Procedure Laterality Date   CESAREAN SECTION     MYOMECTOMY  2005   removed    Family History  Problem Relation Age of Onset   Dementia Mother    Hypertension Mother    Diabetes Mother    Breast cancer Mother 4   Uterine cancer Mother    Heart disease Father    Hypertension Father    Cancer Sister    Asthma Sister    Parkinson's disease Sister    Other Sister        died at birth   Hypertension Sister    Uterine cancer Maternal Grandmother    Breast cancer Niece 67   Colon cancer Neg Hx    Rectal cancer Neg Hx    Stomach cancer Neg Hx    Esophageal cancer Neg Hx     Social History   Socioeconomic History   Marital status: Married    Spouse name: Not on file   Number of children: 1  Years of education: Not on file   Highest education level: Not on file  Occupational History   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[email or web address]    Comment: Retired   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  Tobacco Use   Smoking status: Never    Passive exposure: Never   Smokeless tobacco: Never  Vaping Use   Vaping Use: Never used  Substance and Sexual Activity   Alcohol use: No    Alcohol/week: 0.0 standard drinks of alcohol   Drug use: No   Sexual activity: Not on file  Other Topics Concern   Not on file  Social History Narrative   No living will   Request husband and daughter as medical decision makers   Would accept resuscitation   No prolonged tube feeds if cognitively unaware   Social Determinants of Health   Financial Resource Strain: Not on file  Food Insecurity: Not on file  Transportation Needs: Not on file  Physical Activity: Not on file  Stress: Not on file  Social Connections: Not on file  Intimate Partner Violence: Not on file    Review of Systems No leg weakness No radiation down legs No hematuria---no upper back pain (like before)     Objective:   Physical Exam Constitutional:      Appearance: Normal appearance.  Musculoskeletal:     Comments: No spine tenderness No hip/groin tenderness Right hip normal Tenderness over hip bursa on left Some pain and limitation of internal rotation left hip  Neurological:     Mental Status: She is alert.     Comments: Very slow gait No apparent leg weakness            Assessment & Plan:

## 2022-11-20 NOTE — Patient Instructions (Signed)
I would recommend ice on the painful spot. You can also try over the counter lidocaine patch &/or topical diclofenac. If the pain continues, I would set you up with an orthopedist or my sports medicine partner

## 2022-11-20 NOTE — Assessment & Plan Note (Signed)
And in groin Doesn't seem to be bad enough arthritis to cause this--and was sudden Has tender trochanteric bursa----will try ice, lidocaine &/or diclofenac topical

## 2022-11-30 DIAGNOSIS — G518 Other disorders of facial nerve: Secondary | ICD-10-CM | POA: Diagnosis not present

## 2022-11-30 DIAGNOSIS — M542 Cervicalgia: Secondary | ICD-10-CM | POA: Diagnosis not present

## 2022-11-30 DIAGNOSIS — G4452 New daily persistent headache (NDPH): Secondary | ICD-10-CM | POA: Diagnosis not present

## 2022-11-30 DIAGNOSIS — M791 Myalgia, unspecified site: Secondary | ICD-10-CM | POA: Diagnosis not present

## 2022-12-22 ENCOUNTER — Other Ambulatory Visit: Payer: Self-pay | Admitting: Internal Medicine

## 2022-12-22 NOTE — Telephone Encounter (Signed)
Called and talked with patient she is talking about 2 times a day during the summer. She did request refill. Ok to send in?

## 2023-01-15 DIAGNOSIS — G518 Other disorders of facial nerve: Secondary | ICD-10-CM | POA: Diagnosis not present

## 2023-01-15 DIAGNOSIS — M542 Cervicalgia: Secondary | ICD-10-CM | POA: Diagnosis not present

## 2023-01-15 DIAGNOSIS — G4452 New daily persistent headache (NDPH): Secondary | ICD-10-CM | POA: Diagnosis not present

## 2023-01-15 DIAGNOSIS — M791 Myalgia, unspecified site: Secondary | ICD-10-CM | POA: Diagnosis not present

## 2023-02-27 DIAGNOSIS — M542 Cervicalgia: Secondary | ICD-10-CM | POA: Diagnosis not present

## 2023-02-27 DIAGNOSIS — G518 Other disorders of facial nerve: Secondary | ICD-10-CM | POA: Diagnosis not present

## 2023-02-27 DIAGNOSIS — M791 Myalgia, unspecified site: Secondary | ICD-10-CM | POA: Diagnosis not present

## 2023-02-27 DIAGNOSIS — G4452 New daily persistent headache (NDPH): Secondary | ICD-10-CM | POA: Diagnosis not present

## 2023-03-26 ENCOUNTER — Encounter: Payer: Self-pay | Admitting: Internal Medicine

## 2023-04-08 ENCOUNTER — Encounter: Payer: Self-pay | Admitting: Internal Medicine

## 2023-04-10 DIAGNOSIS — M542 Cervicalgia: Secondary | ICD-10-CM | POA: Diagnosis not present

## 2023-04-10 DIAGNOSIS — M791 Myalgia, unspecified site: Secondary | ICD-10-CM | POA: Diagnosis not present

## 2023-04-10 DIAGNOSIS — G4452 New daily persistent headache (NDPH): Secondary | ICD-10-CM | POA: Diagnosis not present

## 2023-04-10 DIAGNOSIS — G518 Other disorders of facial nerve: Secondary | ICD-10-CM | POA: Diagnosis not present

## 2023-04-11 DIAGNOSIS — Z0100 Encounter for examination of eyes and vision without abnormal findings: Secondary | ICD-10-CM | POA: Diagnosis not present

## 2023-04-11 DIAGNOSIS — H43813 Vitreous degeneration, bilateral: Secondary | ICD-10-CM | POA: Diagnosis not present

## 2023-04-11 DIAGNOSIS — H04123 Dry eye syndrome of bilateral lacrimal glands: Secondary | ICD-10-CM | POA: Diagnosis not present

## 2023-04-11 DIAGNOSIS — H2513 Age-related nuclear cataract, bilateral: Secondary | ICD-10-CM | POA: Diagnosis not present

## 2023-04-11 DIAGNOSIS — H25013 Cortical age-related cataract, bilateral: Secondary | ICD-10-CM | POA: Diagnosis not present

## 2023-04-16 ENCOUNTER — Encounter: Payer: Self-pay | Admitting: Internal Medicine

## 2023-04-16 ENCOUNTER — Ambulatory Visit (INDEPENDENT_AMBULATORY_CARE_PROVIDER_SITE_OTHER): Payer: Medicare HMO | Admitting: Internal Medicine

## 2023-04-16 VITALS — BP 102/68 | HR 72 | Temp 98.6°F | Ht 64.5 in | Wt 211.0 lb

## 2023-04-16 DIAGNOSIS — J0140 Acute pansinusitis, unspecified: Secondary | ICD-10-CM | POA: Insufficient documentation

## 2023-04-16 MED ORDER — DOXYCYCLINE HYCLATE 100 MG PO TABS: 100.0000 mg | ORAL_TABLET | Freq: Two times a day (BID) | ORAL | 0 refills | Status: DC

## 2023-04-16 NOTE — Assessment & Plan Note (Signed)
2 weeks of symptoms --seems all in head No exacerbation of asthma--reassured about that Discussed cleaning ears with peroxide solution Doxycycline 100 bid x 7 days

## 2023-04-16 NOTE — Progress Notes (Signed)
Subjective:    Patient ID: Madison Stout, female    DOB: 10/08/1950, 72 y.o.   MRN: [id number]  HPI Here with husband due to respiratory symptoms  Had flu vaccine 3 weeks ago Had been concerned her asthma medication wasn't working---wanted something different (but didn't go in for a change) Now with 2 weeks of respiratory symptoms Cough, headache, sweat in head Clear mucus with cough---post nasal drip No SOB, wheezing---but exercise tolerance is down (overheats easy) No sore throat Some right ear pain  Tried some emergent-C----kept her up that night No tylenol Uses the singulair and albuterol  Current Outpatient Medications on File Prior to Visit  Medication Sig Dispense Refill   albuterol (VENTOLIN HFA) 108 (90 Base) MCG/ACT inhaler Inhale 2 puffs into the lungs every 6 (six) hours as needed. 16 g 5   aspirin 81 MG tablet Take 81 mg by mouth daily.     baclofen (LIORESAL) 10 MG tablet Take 10 mg by mouth 2 (two) times daily as needed.     colchicine 0.6 MG tablet Take 1 tablet (0.6 mg total) by mouth 2 (two) times daily as needed. 60 tablet 0   fexofenadine (ALLEGRA) 180 MG tablet Take 180 mg by mouth daily.     fluconazole (DIFLUCAN) 150 MG tablet TAKE 1 TABLET EVERY WEEK 12 tablet 3   fluticasone (FLONASE) 50 MCG/ACT nasal spray USE TWO SPRAYS IN EACH NOSTRIL ONCE DAILY. 48 g 10   meclizine (ANTIVERT) 25 MG tablet TAKE 1 TABLET THREE TIMES DAILY AS NEEDED FOR DIZZINESS 100 tablet 3   montelukast (SINGULAIR) 10 MG tablet Take 1 tablet (10 mg total) by mouth at bedtime. 90 tablet 3   Polyethyl Glycol-Propyl Glycol 0.4-0.3 % SOLN Place 1 drop into both eyes as needed.     traMADol (ULTRAM) 50 MG tablet Take 1 tablet (50 mg total) by mouth every 8 (eight) hours as needed. 30 tablet 0   triamterene-hydrochlorothiazide (MAXZIDE-25) 37.5-25 MG tablet Take 1 tablet by mouth daily. 90 tablet 3   zonisamide (ZONEGRAN) 25 MG capsule Take 100 mg by mouth at bedtime.     No current  facility-administered medications on file prior to visit.    Allergies  Allergen Reactions   Clindamycin/Lincomycin     Chest felt tight, breathing off   Ampicillin    Penicillins    Trileptal [Oxcarbazepine] Rash    Past Medical History:  Diagnosis Date   Allergy    Asthma    Chronic headaches    Hypertension    Kidney stones    Pneumonia     Past Surgical History:  Procedure Laterality Date   CESAREAN SECTION     MYOMECTOMY  2005   removed    Family History  Problem Relation Age of Onset   Dementia Mother    Hypertension Mother    Diabetes Mother    Breast cancer Mother 85   Uterine cancer Mother    Heart disease Father    Hypertension Father    Cancer Sister    Asthma Sister    Parkinson's disease Sister    Other Sister        died at birth   Hypertension Sister    Uterine cancer Maternal Grandmother    Breast cancer Niece 36   Colon cancer Neg Hx    Rectal cancer Neg Hx    Stomach cancer Neg Hx    Esophageal cancer Neg Hx     Social History   Socioeconomic History  Marital status: Married    Spouse name: Not on file   Number of children: 1   Years of education: Not on file   Highest education level: Not on file  Occupational History   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[employer]    Comment: Retired   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  Tobacco Use   Smoking status: Never    Passive exposure: Never   Smokeless tobacco: Never  Vaping Use   Vaping status: Never Used  Substance and Sexual Activity   Alcohol use: No    Alcohol/week: 0.0 standard drinks of alcohol   Drug use: No   Sexual activity: Not on file  Other Topics Concern   Not on file  Social History Narrative   No living will   Request husband and daughter as medical decision makers   Would accept resuscitation   No prolonged tube feeds if cognitively unaware   Social Determinants of Health   Financial Resource Strain: Not on file  Food  Insecurity: Not on file  Transportation Needs: Not on file  Physical Activity: Not on file  Stress: Not on file  Social Connections: Not on file  Intimate Partner Violence: Not on file   Review of Systems Appetite is off Taste/smell long gone Some nausea--no vomiting    Objective:   Physical Exam Constitutional:      Appearance: Normal appearance.  HENT:     Head:     Comments: No sinus tenderness    Ears:     Comments: Cerumen bilaterally    Mouth/Throat:     Pharynx: No oropharyngeal exudate or posterior oropharyngeal erythema.  Pulmonary:     Effort: Pulmonary effort is normal.     Breath sounds: Normal breath sounds. No wheezing or rales.  Musculoskeletal:     Cervical back: Neck supple.  Lymphadenopathy:     Cervical: No cervical adenopathy.  Neurological:     Mental Status: She is alert.            Assessment & Plan:

## 2023-05-14 ENCOUNTER — Other Ambulatory Visit: Payer: Self-pay | Admitting: Internal Medicine

## 2023-05-21 ENCOUNTER — Other Ambulatory Visit: Payer: Self-pay | Admitting: Internal Medicine

## 2023-05-23 DIAGNOSIS — H524 Presbyopia: Secondary | ICD-10-CM | POA: Diagnosis not present

## 2023-05-29 DIAGNOSIS — G518 Other disorders of facial nerve: Secondary | ICD-10-CM | POA: Diagnosis not present

## 2023-05-29 DIAGNOSIS — M542 Cervicalgia: Secondary | ICD-10-CM | POA: Diagnosis not present

## 2023-05-29 DIAGNOSIS — G4452 New daily persistent headache (NDPH): Secondary | ICD-10-CM | POA: Diagnosis not present

## 2023-05-29 DIAGNOSIS — M791 Myalgia, unspecified site: Secondary | ICD-10-CM | POA: Diagnosis not present

## 2023-07-07 ENCOUNTER — Other Ambulatory Visit: Payer: Self-pay | Admitting: Internal Medicine

## 2023-07-12 DIAGNOSIS — M791 Myalgia, unspecified site: Secondary | ICD-10-CM | POA: Diagnosis not present

## 2023-07-12 DIAGNOSIS — G518 Other disorders of facial nerve: Secondary | ICD-10-CM | POA: Diagnosis not present

## 2023-07-12 DIAGNOSIS — M542 Cervicalgia: Secondary | ICD-10-CM | POA: Diagnosis not present

## 2023-07-12 DIAGNOSIS — G4452 New daily persistent headache (NDPH): Secondary | ICD-10-CM | POA: Diagnosis not present

## 2023-07-25 ENCOUNTER — Other Ambulatory Visit: Payer: Self-pay | Admitting: Internal Medicine

## 2023-07-30 ENCOUNTER — Encounter: Payer: Self-pay | Admitting: Internal Medicine

## 2023-07-30 ENCOUNTER — Ambulatory Visit: Payer: Medicare HMO | Admitting: Internal Medicine

## 2023-07-30 VITALS — BP 120/76 | HR 78 | Temp 98.0°F | Ht 64.25 in | Wt 213.0 lb

## 2023-07-30 DIAGNOSIS — Z0000 Encounter for general adult medical examination without abnormal findings: Secondary | ICD-10-CM | POA: Diagnosis not present

## 2023-07-30 DIAGNOSIS — I10 Essential (primary) hypertension: Secondary | ICD-10-CM | POA: Diagnosis not present

## 2023-07-30 DIAGNOSIS — J4520 Mild intermittent asthma, uncomplicated: Secondary | ICD-10-CM | POA: Diagnosis not present

## 2023-07-30 DIAGNOSIS — N1831 Chronic kidney disease, stage 3a: Secondary | ICD-10-CM | POA: Diagnosis not present

## 2023-07-30 NOTE — Assessment & Plan Note (Signed)
Stable and mild Will recheck levels

## 2023-07-30 NOTE — Assessment & Plan Note (Signed)
BP Readings from Last 3 Encounters:  07/30/23 120/76  04/16/23 102/68  11/20/22 132/74   Good control on the maxzide 25 If any more joint pains suggestive of gout--would change (only had it once)

## 2023-07-30 NOTE — Assessment & Plan Note (Signed)
Mostly controlled with the montelukast Some increased symptoms in the cold--uses the albuterol more

## 2023-07-30 NOTE — Progress Notes (Signed)
Hearing Screening - Comments:: February 2024 Vision Screening - Comments:: September 2024

## 2023-07-30 NOTE — Assessment & Plan Note (Signed)
BMI 36 with HTN, asthma, arthritis Has been eating okay--but should exercise

## 2023-07-30 NOTE — Progress Notes (Signed)
Subjective:    Patient ID: Madison Stout, female    DOB: May 01, 1951, 73 y.o.   MRN: [id number]  HPI Here with husband for Medicare wellness visit and follow up of chronic medical conditions Reviewed advanced directives Reviewed other doctors---Dr King--ophthal, Dr Valente David, Dr Beckey Rutter, Dr Ty Hilts No hospitalizations or surgery in the past year No regular exercise Vision is okay--- early cataracts Hearing is fair---some problems No alcohol or tobacco No falls No depression or anhedonia Independent with instrumental ADLs Some ongoing memory issues---name recall mostly..   Asthma seems controlled Notices more chest discomfort in the cold Albuterol does resolve it  Occasional palpitations--like making the bed No dizziness or syncope No other chest pain No edema Headaches better--still getting monthly botox  Has indentation on lower lateral left calf No pain  Had attack in 2nd toe last year No problems since  GFR stable in the 50's for several years BMI 36 and relatively stable  Current Outpatient Medications on File Prior to Visit  Medication Sig Dispense Refill   albuterol (VENTOLIN HFA) 108 (90 Base) MCG/ACT inhaler Inhale 2 puffs into the lungs every 6 (six) hours as needed. 16 g 5   aspirin 81 MG tablet Take 81 mg by mouth daily.     baclofen (LIORESAL) 10 MG tablet Take 10 mg by mouth 2 (two) times daily as needed.     colchicine 0.6 MG tablet Take 1 tablet (0.6 mg total) by mouth 2 (two) times daily as needed. 60 tablet 0   fexofenadine (ALLEGRA) 180 MG tablet Take 180 mg by mouth daily.     fluconazole (DIFLUCAN) 150 MG tablet TAKE 1 TABLET EVERY WEEK 12 tablet 3   fluticasone (FLONASE) 50 MCG/ACT nasal spray USE TWO SPRAYS IN EACH NOSTRIL ONE TIME DAILY. 48 g 3   meclizine (ANTIVERT) 25 MG tablet TAKE 1 TABLET THREE TIMES DAILY AS NEEDED FOR DIZZINESS 100 tablet 3   montelukast (SINGULAIR) 10 MG tablet TAKE 1 TABLET (10 MG TOTAL) BY MOUTH AT  BEDTIME. 90 tablet 3   Polyethyl Glycol-Propyl Glycol 0.4-0.3 % SOLN Place 1 drop into both eyes as needed.     traMADol (ULTRAM) 50 MG tablet Take 1 tablet (50 mg total) by mouth every 8 (eight) hours as needed. 30 tablet 0   triamterene-hydrochlorothiazide (MAXZIDE-25) 37.5-25 MG tablet TAKE 1 TABLET EVERY DAY 90 tablet 3   zonisamide (ZONEGRAN) 25 MG capsule Take 100 mg by mouth at bedtime.     No current facility-administered medications on file prior to visit.    Allergies  Allergen Reactions   Clindamycin/Lincomycin     Chest felt tight, breathing off   Doxycycline Nausea And Vomiting   Ampicillin    Penicillins    Trileptal [Oxcarbazepine] Rash    Past Medical History:  Diagnosis Date   Allergy    Asthma    Chronic headaches    Hypertension    Kidney stones    Pneumonia     Past Surgical History:  Procedure Laterality Date   CESAREAN SECTION     MYOMECTOMY  2005   removed    Family History  Problem Relation Age of Onset   Dementia Mother    Hypertension Mother    Diabetes Mother    Breast cancer Mother 29   Uterine cancer Mother    Heart disease Father    Hypertension Father    Cancer Sister    Asthma Sister    Parkinson's disease Sister    Other Sister  died at birth   Hypertension Sister    Uterine cancer Maternal Grandmother    Breast cancer Niece 41   Colon cancer Neg Hx    Rectal cancer Neg Hx    Stomach cancer Neg Hx    Esophageal cancer Neg Hx     Social History   Socioeconomic History   Marital status: Married    Spouse name: Not on file   Number of children: 1   Years of education: Not on file   Highest education level: Not on file  Occupational History   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  Tobacco Use   Smoking status: Never    Passive exposure: Never   Smokeless tobacco: Never  Vaping Use   Vaping status: Never Used   Substance and Sexual Activity   Alcohol use: No    Alcohol/week: 0.0 standard drinks of alcohol   Drug use: No   Sexual activity: Not on file  Other Topics Concern   Not on file  Social History Narrative   No living will   Request husband and daughter as medical decision makers   Would accept resuscitation   No prolonged tube feeds if cognitively unaware   Social Drivers of Corporate investment banker Strain: Not on file  Food Insecurity: Not on file  Transportation Needs: Not on file  Physical Activity: Not on file  Stress: Not on file  Social Connections: Not on file  Intimate Partner Violence: Not on file   Review of Systems Appetite is okay Weight is stable Sleeps fair Wears seat belt Wears dentures ---but not regularly Has recurrent yeast rash under breasts---mostly in summer. Takes it weekly then--and prn otherwise No other skin issues---but does not some "whelps" after bath No heartburn or dysphagia Bowels are slow--greens not helping plus senna s.This usually helps. No blood Voids okay--some increased frequency/urgency. No incontinence Left sciatica and left shoulder issues. Using brace for leg and topical for both. Tramadol rarely    Objective:   Physical Exam Constitutional:      Appearance: Normal appearance.  HENT:     Mouth/Throat:     Pharynx: No oropharyngeal exudate or posterior oropharyngeal erythema.  Eyes:     Conjunctiva/sclera: Conjunctivae normal.     Pupils: Pupils are equal, round, and reactive to light.  Cardiovascular:     Rate and Rhythm: Normal rate and regular rhythm.     Pulses: Normal pulses.     Heart sounds: No murmur heard.    No gallop.  Pulmonary:     Effort: Pulmonary effort is normal.     Breath sounds: Normal breath sounds. No wheezing or rales.  Abdominal:     Palpations: Abdomen is soft.     Tenderness: There is no abdominal tenderness.  Musculoskeletal:     Cervical back: Neck supple.     Right lower leg: No edema.      Left lower leg: No edema.  Lymphadenopathy:     Cervical: No cervical adenopathy.  Skin:    Findings: No rash.  Neurological:     General: No focal deficit present.     Mental Status: She is alert and oriented to person, place, and time.     Comments: Mini-cog normal  Psychiatric:        Mood and Affect: Mood normal.        Behavior: Behavior  normal.            Assessment & Plan:

## 2023-07-30 NOTE — Assessment & Plan Note (Signed)
I have personally reviewed the Medicare Annual Wellness questionnaire and have noted 1. The patient's medical and social history 2. Their use of alcohol, tobacco or illicit drugs 3. Their current medications and supplements 4. The patient's functional ability including ADL's, fall risks, home safety risks and hearing or visual             impairment. 5. Diet and physical activities 6. Evidence for depression or mood disorders  The patients weight, height, BMI and visual acuity have been recorded in the chart I have made referrals, counseling and provided education to the patient based review of the above and I have provided the pt with a written personalized care plan for preventive services.  I have provided you with a copy of your personalized plan for preventive services. Please take the time to review along with your updated medication list.  Mammogram yearly till at least 77--due March Colonoscopy due this March Discussed doing more exercise Had flu vaccine---reaction to COVID vaccine Needs 2nd shingrix

## 2023-07-31 ENCOUNTER — Other Ambulatory Visit: Payer: Self-pay

## 2023-07-31 ENCOUNTER — Encounter: Payer: Self-pay | Admitting: Internal Medicine

## 2023-07-31 LAB — HEPATIC FUNCTION PANEL
ALT: 10 U/L (ref 0–35)
AST: 14 U/L (ref 0–37)
Albumin: 4.5 g/dL (ref 3.5–5.2)
Alkaline Phosphatase: 85 U/L (ref 39–117)
Bilirubin, Direct: 0.1 mg/dL (ref 0.0–0.3)
Total Bilirubin: 0.5 mg/dL (ref 0.2–1.2)
Total Protein: 7.7 g/dL (ref 6.0–8.3)

## 2023-07-31 LAB — LIPID PANEL
Cholesterol: 205 mg/dL — ABNORMAL HIGH (ref 0–200)
HDL: 58.2 mg/dL (ref >39.00)
LDL Cholesterol: 118 mg/dL — ABNORMAL HIGH (ref 0–99)
NonHDL: 147.07
Total CHOL/HDL Ratio: 4
Triglycerides: 147 mg/dL (ref 0.0–149.0)
VLDL: 29.4 mg/dL (ref 0.0–40.0)

## 2023-07-31 LAB — PARATHYROID HORMONE, INTACT (NO CA): PTH: 62 pg/mL (ref 16–77)

## 2023-07-31 LAB — RENAL FUNCTION PANEL
Albumin: 4.5 g/dL (ref 3.5–5.2)
BUN: 15 mg/dL (ref 6–23)
CO2: 26 meq/L (ref 19–32)
Calcium: 9.8 mg/dL (ref 8.4–10.5)
Chloride: 102 meq/L (ref 96–112)
Creatinine, Ser: 1.01 mg/dL (ref 0.40–1.20)
GFR: 56.28 mL/min — ABNORMAL LOW (ref >60.00)
Glucose, Bld: 83 mg/dL (ref 70–99)
Phosphorus: 3.3 mg/dL (ref 2.3–4.6)
Potassium: 3.4 meq/L — ABNORMAL LOW (ref 3.5–5.1)
Sodium: 140 meq/L (ref 135–145)

## 2023-07-31 LAB — CBC
HCT: 41.3 % (ref 36.0–46.0)
Hemoglobin: 13.6 g/dL (ref 12.0–15.0)
MCHC: 33 g/dL (ref 30.0–36.0)
MCV: 79.6 fL (ref 78.0–100.0)
Platelets: 221 10*3/uL (ref 150.0–400.0)
RBC: 5.19 Mil/uL — ABNORMAL HIGH (ref 3.87–5.11)
RDW: 14.4 % (ref 11.5–15.5)
WBC: 5.2 10*3/uL (ref 4.0–10.5)

## 2023-07-31 LAB — VITAMIN D 25 HYDROXY (VIT D DEFICIENCY, FRACTURES): VITD: 12.6 ng/mL — ABNORMAL LOW (ref 30.00–100.00)

## 2023-07-31 NOTE — Progress Notes (Signed)
Added Vit D to med list

## 2023-08-08 ENCOUNTER — Other Ambulatory Visit: Payer: Self-pay | Admitting: Internal Medicine

## 2023-09-06 ENCOUNTER — Encounter: Payer: Self-pay | Admitting: Gastroenterology

## 2023-09-06 DIAGNOSIS — M542 Cervicalgia: Secondary | ICD-10-CM | POA: Diagnosis not present

## 2023-09-06 DIAGNOSIS — G518 Other disorders of facial nerve: Secondary | ICD-10-CM | POA: Diagnosis not present

## 2023-09-06 DIAGNOSIS — G4452 New daily persistent headache (NDPH): Secondary | ICD-10-CM | POA: Diagnosis not present

## 2023-09-06 DIAGNOSIS — M791 Myalgia, unspecified site: Secondary | ICD-10-CM | POA: Diagnosis not present

## 2023-09-10 ENCOUNTER — Other Ambulatory Visit: Payer: Self-pay | Admitting: Internal Medicine

## 2023-09-10 DIAGNOSIS — Z1231 Encounter for screening mammogram for malignant neoplasm of breast: Secondary | ICD-10-CM

## 2023-09-12 ENCOUNTER — Encounter: Payer: Self-pay | Admitting: *Deleted

## 2023-09-12 ENCOUNTER — Encounter: Payer: Self-pay | Admitting: Internal Medicine

## 2023-09-12 ENCOUNTER — Ambulatory Visit (INDEPENDENT_AMBULATORY_CARE_PROVIDER_SITE_OTHER): Admitting: Internal Medicine

## 2023-09-12 VITALS — BP 116/70 | HR 90 | Temp 97.7°F | Ht 64.25 in | Wt 208.0 lb

## 2023-09-12 DIAGNOSIS — R1011 Right upper quadrant pain: Secondary | ICD-10-CM | POA: Diagnosis not present

## 2023-09-12 DIAGNOSIS — R109 Unspecified abdominal pain: Secondary | ICD-10-CM | POA: Insufficient documentation

## 2023-09-12 LAB — RENAL FUNCTION PANEL
Albumin: 4.4 g/dL (ref 3.5–5.2)
BUN: 16 mg/dL (ref 6–23)
CO2: 28 meq/L (ref 19–32)
Calcium: 10 mg/dL (ref 8.4–10.5)
Chloride: 99 meq/L (ref 96–112)
Creatinine, Ser: 1.19 mg/dL (ref 0.40–1.20)
GFR: 46.19 mL/min — ABNORMAL LOW (ref >60.00)
Glucose, Bld: 101 mg/dL — ABNORMAL HIGH (ref 70–99)
Phosphorus: 3.4 mg/dL (ref 2.3–4.6)
Potassium: 3.2 meq/L — ABNORMAL LOW (ref 3.5–5.1)
Sodium: 136 meq/L (ref 135–145)

## 2023-09-12 LAB — CBC
HCT: 43.8 % (ref 36.0–46.0)
Hemoglobin: 14.4 g/dL (ref 12.0–15.0)
MCHC: 32.8 g/dL (ref 30.0–36.0)
MCV: 78.9 fl (ref 78.0–100.0)
Platelets: 202 10*3/uL (ref 150.0–400.0)
RBC: 5.55 Mil/uL — ABNORMAL HIGH (ref 3.87–5.11)
RDW: 14.5 % (ref 11.5–15.5)
WBC: 3.8 10*3/uL — ABNORMAL LOW (ref 4.0–10.5)

## 2023-09-12 LAB — SEDIMENTATION RATE: Sed Rate: 49 mm/h — ABNORMAL HIGH (ref 0–30)

## 2023-09-12 LAB — HEPATIC FUNCTION PANEL
ALT: 16 U/L (ref 0–35)
AST: 18 U/L (ref 0–37)
Albumin: 4.4 g/dL (ref 3.5–5.2)
Alkaline Phosphatase: 74 U/L (ref 39–117)
Bilirubin, Direct: 0.1 mg/dL (ref 0.0–0.3)
Total Bilirubin: 0.6 mg/dL (ref 0.2–1.2)
Total Protein: 8.2 g/dL (ref 6.0–8.3)

## 2023-09-12 LAB — AMYLASE: Amylase: 37 U/L (ref 27–131)

## 2023-09-12 NOTE — Progress Notes (Signed)
 Subjective:    Patient ID: Madison Stout, female    DOB: 1950/09/13, 73 y.o.   MRN: [id number]  HPI Here due to recurrence of GI symptoms With husband  Has some stomach pain--if eats or even if she doesn't  Across lower abdomen--sharp Can be severe--then goes away Sometimes at night Diarrhea--seemed dark--and now even worse after pepto About 3 stools per dark---no clear blood Pain not necessarily better after moving bowels  Some nausea but no vomiting--not necessarily post prandial  Has spell in the fall--but improved Now back and worse  Current Outpatient Medications on File Prior to Visit  Medication Sig Dispense Refill   albuterol (VENTOLIN HFA) 108 (90 Base) MCG/ACT inhaler INHALE 2 PUFFS INTO THE LUNGS EVERY 6 (SIX) HOURS AS NEEDED. 1 each 2   aspirin 81 MG tablet Take 81 mg by mouth daily.     baclofen (LIORESAL) 10 MG tablet Take 10 mg by mouth 2 (two) times daily as needed.     cholecalciferol (VITAMIN D3) 25 MCG (1000 UNIT) tablet Take 1,000 Units by mouth daily.     colchicine 0.6 MG tablet Take 1 tablet (0.6 mg total) by mouth 2 (two) times daily as needed. 60 tablet 0   fexofenadine (ALLEGRA) 180 MG tablet Take 180 mg by mouth daily.     fluconazole (DIFLUCAN) 150 MG tablet TAKE 1 TABLET EVERY WEEK 12 tablet 3   fluticasone (FLONASE) 50 MCG/ACT nasal spray USE TWO SPRAYS IN EACH NOSTRIL ONE TIME DAILY. 48 g 3   meclizine (ANTIVERT) 25 MG tablet TAKE 1 TABLET THREE TIMES DAILY AS NEEDED FOR DIZZINESS 100 tablet 3   montelukast (SINGULAIR) 10 MG tablet TAKE 1 TABLET (10 MG TOTAL) BY MOUTH AT BEDTIME. 90 tablet 3   Polyethyl Glycol-Propyl Glycol 0.4-0.3 % SOLN Place 1 drop into both eyes as needed.     traMADol (ULTRAM) 50 MG tablet Take 1 tablet (50 mg total) by mouth every 8 (eight) hours as needed. 30 tablet 0   triamterene-hydrochlorothiazide (MAXZIDE-25) 37.5-25 MG tablet TAKE 1 TABLET EVERY DAY 90 tablet 3   zonisamide (ZONEGRAN) 25 MG capsule Take 100 mg by  mouth at bedtime.     No current facility-administered medications on file prior to visit.    Allergies  Allergen Reactions   Clindamycin/Lincomycin     Chest felt tight, breathing off   Doxycycline Nausea And Vomiting   Ampicillin    Penicillins    Trileptal [Oxcarbazepine] Rash    Past Medical History:  Diagnosis Date   Allergy    Asthma    Chronic headaches    Hypertension    Kidney stones    Pneumonia     Past Surgical History:  Procedure Laterality Date   CESAREAN SECTION     MYOMECTOMY  2005   removed    Family History  Problem Relation Age of Onset   Dementia Mother    Hypertension Mother    Diabetes Mother    Breast cancer Mother 82   Uterine cancer Mother    Heart disease Father    Hypertension Father    Cancer Sister    Asthma Sister    Parkinson's disease Sister    Other Sister        died at birth   Hypertension Sister    Uterine cancer Maternal Grandmother    Breast cancer Niece 2   Colon cancer Neg Hx    Rectal cancer Neg Hx    Stomach cancer Neg Hx  Esophageal cancer Neg Hx     Social History   Socioeconomic History   Marital status: Married    Spouse name: Not on file   Number of children: 1   Years of education: Not on file   Highest education level: Not on file  Occupational History   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  Tobacco Use   Smoking status: Never    Passive exposure: Never   Smokeless tobacco: Never  Vaping Use   Vaping status: Never Used  Substance and Sexual Activity   Alcohol use: No    Alcohol/week: 0.0 standard drinks of alcohol   Drug use: No   Sexual activity: Not on file  Other Topics Concern   Not on file  Social History Narrative   No living will   Request husband and daughter as medical decision makers   Would accept resuscitation   No prolonged tube feeds if cognitively unaware   Social  Drivers of Corporate investment banker Strain: Not on file  Food Insecurity: Not on file  Transportation Needs: Not on file  Physical Activity: Not on file  Stress: Not on file  Social Connections: Not on file  Intimate Partner Violence: Not on file   Review of Systems Appetite is down Has lost a few pounds Feeling cold    Objective:   Physical Exam Constitutional:      Appearance: She is well-developed.  Cardiovascular:     Rate and Rhythm: Normal rate and regular rhythm.     Heart sounds: No murmur heard.    No gallop.  Pulmonary:     Effort: Pulmonary effort is normal.     Breath sounds: Normal breath sounds. No wheezing or rales.  Abdominal:     General: Bowel sounds are normal.     Palpations: Abdomen is soft.     Comments: Mild lower abdominal tenderness Moderate RUQ tenderness---slightly positive Murphy's  Musculoskeletal:     Cervical back: Neck supple.  Lymphadenopathy:     Cervical: No cervical adenopathy.  Neurological:     Mental Status: She is alert.            Assessment & Plan:

## 2023-09-12 NOTE — Assessment & Plan Note (Addendum)
 Also lower abdominal Symptoms are not all post prandial With tender area--gallbladder is possible Will check labs--including sed rate and amylase Abdominal ultrasound Due for repeat colonoscopy now anywhere---will contact Dr Myrtie Neither if no answers (low chance of IBD)

## 2023-09-13 ENCOUNTER — Encounter: Payer: Self-pay | Admitting: Internal Medicine

## 2023-09-14 ENCOUNTER — Other Ambulatory Visit: Payer: Self-pay

## 2023-09-14 MED ORDER — POTASSIUM CHLORIDE CRYS ER 20 MEQ PO TBCR: 20.0000 meq | EXTENDED_RELEASE_TABLET | Freq: Every day | ORAL | 3 refills | Status: DC

## 2023-09-18 ENCOUNTER — Other Ambulatory Visit: Payer: Self-pay | Admitting: Internal Medicine

## 2023-09-18 DIAGNOSIS — R1011 Right upper quadrant pain: Secondary | ICD-10-CM

## 2023-09-19 ENCOUNTER — Ambulatory Visit
Admission: RE | Admit: 2023-09-19 | Discharge: 2023-09-19 | Disposition: A | Discharge status: Home / Self Care | Source: Ambulatory Visit | Attending: Internal Medicine | Admitting: Internal Medicine

## 2023-09-19 DIAGNOSIS — R1011 Right upper quadrant pain: Secondary | ICD-10-CM | POA: Insufficient documentation

## 2023-09-19 DIAGNOSIS — D259 Leiomyoma of uterus, unspecified: Secondary | ICD-10-CM | POA: Diagnosis not present

## 2023-09-19 DIAGNOSIS — R1030 Lower abdominal pain, unspecified: Secondary | ICD-10-CM | POA: Diagnosis not present

## 2023-09-19 DIAGNOSIS — R109 Unspecified abdominal pain: Secondary | ICD-10-CM | POA: Diagnosis not present

## 2023-09-24 NOTE — Telephone Encounter (Signed)
Patient is requesting US results.  

## 2023-09-25 ENCOUNTER — Encounter: Payer: Self-pay | Admitting: Internal Medicine

## 2023-09-25 ENCOUNTER — Other Ambulatory Visit: Payer: Self-pay | Admitting: Internal Medicine

## 2023-09-25 DIAGNOSIS — R1030 Lower abdominal pain, unspecified: Secondary | ICD-10-CM

## 2023-09-28 ENCOUNTER — Ambulatory Visit
Admission: RE | Admit: 2023-09-28 | Discharge: 2023-09-28 | Disposition: A | Discharge status: Home / Self Care | Source: Ambulatory Visit | Attending: Internal Medicine | Admitting: Internal Medicine

## 2023-09-28 DIAGNOSIS — Z1231 Encounter for screening mammogram for malignant neoplasm of breast: Secondary | ICD-10-CM | POA: Diagnosis not present

## 2023-10-02 ENCOUNTER — Encounter: Payer: Self-pay | Admitting: Internal Medicine

## 2023-10-10 ENCOUNTER — Encounter: Payer: Self-pay | Admitting: Gastroenterology

## 2023-10-17 DIAGNOSIS — M542 Cervicalgia: Secondary | ICD-10-CM | POA: Diagnosis not present

## 2023-10-17 DIAGNOSIS — M791 Myalgia, unspecified site: Secondary | ICD-10-CM | POA: Diagnosis not present

## 2023-10-17 DIAGNOSIS — G518 Other disorders of facial nerve: Secondary | ICD-10-CM | POA: Diagnosis not present

## 2023-10-17 DIAGNOSIS — G4452 New daily persistent headache (NDPH): Secondary | ICD-10-CM | POA: Diagnosis not present

## 2023-11-28 DIAGNOSIS — G518 Other disorders of facial nerve: Secondary | ICD-10-CM | POA: Diagnosis not present

## 2023-11-28 DIAGNOSIS — G4452 New daily persistent headache (NDPH): Secondary | ICD-10-CM | POA: Diagnosis not present

## 2023-11-28 DIAGNOSIS — M791 Myalgia, unspecified site: Secondary | ICD-10-CM | POA: Diagnosis not present

## 2023-11-28 DIAGNOSIS — M542 Cervicalgia: Secondary | ICD-10-CM | POA: Diagnosis not present

## 2023-12-13 ENCOUNTER — Ambulatory Visit: Admitting: Gastroenterology

## 2023-12-20 ENCOUNTER — Ambulatory Visit: Admitting: Gastroenterology

## 2023-12-20 ENCOUNTER — Encounter: Payer: Self-pay | Admitting: Gastroenterology

## 2023-12-20 VITALS — BP 132/80 | HR 87 | Ht 64.25 in | Wt 206.2 lb

## 2023-12-20 DIAGNOSIS — R1031 Right lower quadrant pain: Secondary | ICD-10-CM

## 2023-12-20 DIAGNOSIS — Z860100 Personal history of colon polyps, unspecified: Secondary | ICD-10-CM

## 2023-12-20 DIAGNOSIS — R197 Diarrhea, unspecified: Secondary | ICD-10-CM

## 2023-12-20 MED ORDER — NA SULFATE-K SULFATE-MG SULF 17.5-3.13-1.6 GM/177ML PO SOLN: 1.0000 | Freq: Once | ORAL | 0 refills | Status: AC

## 2023-12-20 NOTE — Patient Instructions (Signed)
 You have been scheduled for a colonoscopy. Please follow written instructions given to you at your visit today.   If you use inhalers (even only as needed), please bring them with you on the day of your procedure.  DO NOT TAKE 7 DAYS PRIOR TO TEST- Trulicity (dulaglutide) Ozempic, Wegovy (semaglutide) Mounjaro (tirzepatide) Bydureon Bcise (exanatide extended release)  DO NOT TAKE 1 DAY PRIOR TO YOUR TEST Rybelsus (semaglutide) Adlyxin (lixisenatide) Victoza (liraglutide) Byetta (exanatide) ___________________________________________________________________________  _______________________________________________________  If your blood pressure at your visit was 140/90 or greater, please contact your primary care physician to follow up on this.  _______________________________________________________  If you are age 62 or older, your body mass index should be between 23-30. Your Body mass index is 35.13 kg/m. If this is out of the aforementioned range listed, please consider follow up with your Primary Care Provider.  If you are age 87 or younger, your body mass index should be between 19-25. Your Body mass index is 35.13 kg/m. If this is out of the aformentioned range listed, please consider follow up with your Primary Care Provider.   ________________________________________________________  The [facility] providers would like to encourage you to use MYCHART to communicate with providers for non-urgent requests or questions.  Due to long hold times on the telephone, sending your provider a message by [facility] may be a faster and more efficient way to get a response.  Please allow 48 business hours for a response.  Please remember that this is for non-urgent requests.  _______________________________________________________

## 2023-12-20 NOTE — Progress Notes (Signed)
 [facility] Consult Note:  History: Madison Stout 12/20/2023  Referring provider: Helaine Llanos, MD  Reason for consult/chief complaint: Abdominal Pain (Pt states that she was having RLQ pain. She states that it lasted for about a month and a half. She stated that she would have to press on the RLQ just to walk.) and Diarrhea (Pt states during the period of her having abd. pain she had watery diarrhea that gained some solidity but not much. She was taking pepto bismol and stools started to change color so she stopped. She states that this is no longer a problem but is a concern.)   Subjective  Prior history: Colonoscopy with Dr. Dominic Friendly March 2022 for heme positive stool ordered by primary care, asymptomatic with normal hemoglobin 3 polyps up to 10 mm removed (tubular adenomas)  (No-show for clinic visit 12/13/2023 -she had the day on her)  ___________________________   Madison Stout was here today with her husband to describe some recent digestive issues going on.  She was seen primary care over the course of a couple of months for persistent right lower quadrant pain and diarrhea that came on gradually and without clear explanation.  There had not been any travel, sick contacts, antibiotics or other change in medicines.  Some testing was done as noted below.  In the last few weeks symptoms have almost completely resolved.  Stools back to formed and regular, she still has a hint of the RLQ pain but nothing like it was.  At its worst it would hurt more so in certain positions and she felt the need to press on that area just to be able to walk around. No hematochezia or melena.  Denies nausea vomiting dysphagia or weight loss.  ROS:  Review of Systems  Constitutional:  Negative for appetite change and unexpected weight change.  HENT:  Negative for mouth sores and voice change.   Eyes:  Negative for pain and redness.  Respiratory:  Negative for cough and shortness of breath.    Cardiovascular:  Negative for chest pain and palpitations.  Genitourinary:  Negative for dysuria and hematuria.  Musculoskeletal:  Positive for arthralgias. Negative for myalgias.  Skin:  Negative for pallor and rash.  Allergic/Immunologic: Positive for environmental allergies.  Neurological:  Negative for weakness and headaches.  Hematological:  Negative for adenopathy.     Past Medical History: Past Medical History:  Diagnosis Date   Allergy    Asthma    Chronic headaches    Hypertension    Kidney stones    Pneumonia      Past Surgical History: Past Surgical History:  Procedure Laterality Date   CESAREAN SECTION     MYOMECTOMY  2005   removed     Family History: Family History  Problem Relation Age of Onset   Dementia Mother    Hypertension Mother    Diabetes Mother    Breast cancer Mother 33   Uterine cancer Mother    Heart disease Father    Hypertension Father    Cancer Sister    Asthma Sister    Parkinson's disease Sister    Other Sister        died at birth   Hypertension Sister    Uterine cancer Maternal Grandmother    Breast cancer Niece 68   Colon cancer Neg Hx    Rectal cancer Neg Hx    Stomach cancer Neg Hx    Esophageal cancer Neg Hx  Social History: Social History   Socioeconomic History   Marital status: Married    Spouse name: Not on file   Number of children: 1   Years of education: Not on file   Highest education level: Not on file  Occupational History   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  Tobacco Use   Smoking status: Never    Passive exposure: Never   Smokeless tobacco: Never  Vaping Use   Vaping status: Never Used  Substance and Sexual Activity   Alcohol use: No    Alcohol/week: 0.0 standard drinks of alcohol   Drug use: No   Sexual activity: Not on file  Other Topics Concern   Not on file  Social History  Narrative   No living will   Request husband and daughter as medical decision makers   Would accept resuscitation   No prolonged tube feeds if cognitively unaware   Social Drivers of Corporate investment banker Strain: Not on file  Food Insecurity: Not on file  Transportation Needs: Not on file  Physical Activity: Not on file  Stress: Not on file  Social Connections: Not on file   Husband had a renal transplant  Allergies: Allergies  Allergen Reactions   Clindamycin/Lincomycin     Chest felt tight, breathing off   Doxycycline  Nausea And Vomiting   Ampicillin    Penicillins    Trileptal  [Oxcarbazepine ] Rash    Outpatient Meds: Current Outpatient Medications  Medication Sig Dispense Refill   albuterol  (VENTOLIN  HFA) 108 (90 Base) MCG/ACT inhaler INHALE 2 PUFFS INTO THE LUNGS EVERY 6 (SIX) HOURS AS NEEDED. 1 each 2   aspirin 81 MG tablet Take 81 mg by mouth daily.     baclofen (LIORESAL) 10 MG tablet Take 10 mg by mouth 2 (two) times daily as needed.     cholecalciferol (VITAMIN D3) 25 MCG (1000 UNIT) tablet Take 1,000 Units by mouth daily.     colchicine  0.6 MG tablet Take 1 tablet (0.6 mg total) by mouth 2 (two) times daily as needed. 60 tablet 0   fexofenadine (ALLEGRA) 180 MG tablet Take 180 mg by mouth daily.     fluconazole  (DIFLUCAN ) 150 MG tablet TAKE 1 TABLET EVERY WEEK 12 tablet 3   fluticasone  (FLONASE ) 50 MCG/ACT nasal spray USE TWO SPRAYS IN EACH NOSTRIL ONE TIME DAILY. 48 g 3   meclizine  (ANTIVERT ) 25 MG tablet TAKE 1 TABLET THREE TIMES DAILY AS NEEDED FOR DIZZINESS 100 tablet 3   montelukast  (SINGULAIR ) 10 MG tablet TAKE 1 TABLET (10 MG TOTAL) BY MOUTH AT BEDTIME. 90 tablet 3   Na Sulfate-K Sulfate-Mg Sulfate concentrate (SUPREP) 17.5-3.13-1.6 GM/177ML SOLN Take 1 kit (354 mLs total) by mouth once for 1 dose. 354 mL 0   Polyethyl Glycol-Propyl Glycol 0.4-0.3 % SOLN Place 1 drop into both eyes as needed.     potassium chloride  SA (KLOR-CON  M) 20 MEQ tablet Take 1  tablet (20 mEq total) by mouth daily. 90 tablet 3   traMADol  (ULTRAM ) 50 MG tablet Take 1 tablet (50 mg total) by mouth every 8 (eight) hours as needed. 30 tablet 0   triamterene -hydrochlorothiazide (MAXZIDE-25) 37.5-25 MG tablet TAKE 1 TABLET EVERY DAY 90 tablet 3   zonisamide (ZONEGRAN) 25 MG capsule Take 100 mg by mouth at bedtime.     No current facility-administered medications for this visit.  ___________________________________________________________________ Objective   Exam:  BP 132/80   Pulse 87   Ht 5' 4.25 (1.632 m)   Wt 206 lb 4 oz (93.6 kg)   BMI 35.13 kg/m  Wt Readings from Last 3 Encounters:  12/20/23 206 lb 4 oz (93.6 kg)  09/12/23 208 lb (94.3 kg)  07/30/23 213 lb (96.6 kg)    General: Well-appearing Eyes: sclera anicteric, no redness ENT: oral mucosa moist without lesions, no cervical or supraclavicular lymphadenopathy CV: Regular without appreciable murmur, no JVD, no peripheral edema Resp: clear to auscultation bilaterally, normal RR and effort noted GI: soft, minimal RLQ tenderness, with active bowel sounds. No guarding or palpable organomegaly noted.  No distention Skin; warm and dry, no rash or jaundice noted Neuro: awake, alert and oriented x 3. Normal gross motor function and fluent speech   Labs:     Latest Ref Rng & Units 09/12/2023   12:08 PM 07/30/2023    3:41 PM 07/26/2022    9:21 AM  CBC  WBC 4.0 - 10.5 K/uL 3.8  5.2  5.9   Hemoglobin 12.0 - 15.0 g/dL 16.1  09.6  04.5   Hematocrit 36.0 - 46.0 % 43.8  41.3  40.8   Platelets 150.0 - 400.0 K/uL 202.0  221.0  228.0       Latest Ref Rng & Units 09/12/2023   12:08 PM 07/30/2023    3:41 PM 07/26/2022    9:21 AM  CMP  Glucose 70 - 99 mg/dL 409  83  89   BUN 6 - 23 mg/dL 16  15  19    Creatinine 0.40 - 1.20 mg/dL 8.11  9.14  7.82   Sodium 135 - 145 mEq/L 136  140  136   Potassium 3.5 - 5.1 mEq/L 3.2  3.4  3.5   Chloride 96 - 112 mEq/L 99  102  100   CO2 19 - 32 mEq/L 28  26  28     Calcium 8.4 - 10.5 mg/dL 95.6  9.8  21.3   Total Protein 6.0 - 8.3 g/dL 8.2  7.7  8.0   Total Bilirubin 0.2 - 1.2 mg/dL 0.6  0.5  0.4   Alkaline Phos 39 - 117 U/L 74  85  88   AST 0 - 37 U/L 18  14  13    ALT 0 - 35 U/L 16  10  10     No stool studies   Radiologic Studies:  CLINICAL DATA:  Abdominal pain for several weeks.   EXAM: ABDOMEN ULTRASOUND COMPLETE   COMPARISON:  None Available.   FINDINGS: Gallbladder: No gallstones or wall thickening visualized. No sonographic Murphy sign noted by sonographer.   Common bile duct: Diameter: 2 mm which is within normal limits.   Liver: No focal lesion identified. Within normal limits in parenchymal echogenicity. Portal vein is patent on color Doppler imaging with normal direction of blood flow towards the liver.   IVC: No abnormality visualized.   Pancreas: Visualized portion unremarkable.   Spleen: Size and appearance within normal limits.   Right Kidney: Length: 10.5 cm. Echogenicity within normal limits. No mass or hydronephrosis visualized.   Left Kidney: Length: 9.7 cm. Echogenicity within normal limits. 1.9 cm simple cyst is noted. No mass or hydronephrosis visualized.   Abdominal aorta: No aneurysm visualized.   Other findings: None.   IMPRESSION: No definite significant abnormality seen in the abdomen.     Electronically Signed   By: Rosalene Colon M.D.   On: 09/25/2023  12:41  _____________________________   EXAM: TRANSABDOMINAL AND TRANSVAGINAL ULTRASOUND OF PELVIS   TECHNIQUE: Both transabdominal and transvaginal ultrasound examinations of the pelvis were performed. Transabdominal technique was performed for global imaging of the pelvis including uterus, ovaries, adnexal regions, and pelvic cul-de-sac. It was necessary to proceed with endovaginal exam following the transabdominal exam to visualize the endometrium and ovaries.   COMPARISON:  None Available.   FINDINGS: Uterus   Measurements:  6.7 x 3.6 x 2.5 cm = volume: 31 mL. 1.8 cm calcified fibroid is noted anteriorly in fundus.   Endometrium   Thickness: 4 mm which is within normal limits. No focal abnormality visualized.   Right ovary   Not visualized.   Left ovary   Not visualized.   Other findings   No abnormal free fluid.   IMPRESSION: 1.8 cm calcified uterine fibroid noted in fundus.   Ovaries are not visualized.     Electronically Signed   By: Rosalene Colon M.D.   Encounter Diagnoses  Name Primary?   RLQ abdominal pain Yes   Acute diarrhea    Hx of colonic polyps    Assessment and plan  She had about 6 weeks of right lower quadrant pain and diarrhea that was of unclear cause, and has now almost completely resolved.  Bowel habits back to normal, very little pain left.  May have been some protracted infection or postinfectious syndrome.  No diarrhea at this point to need a stool study.  Doubtful IBD given significant improvement in symptoms at this point Reassuring labs and imaging noted above  She is due for colon polyp surveillance and wished to proceed with colonoscopy scheduling today. Agreeable after discussion of procedure and risks.  The benefits and risks of the planned procedure(s) were described in detail with the patient or (when appropriate) their health care proxy.  Risks were outlined as including, but not limited to, bleeding, infection, perforation, adverse medication reaction leading to cardiac or pulmonary decompensation, pancreatitis (if ERCP).  The limitation of incomplete mucosal visualization was also discussed.  No guarantees or warranties were given.   Thank you for the courtesy of this consult.  Please call me with any questions or concerns.  Kerby Pearson III  CC: Referring provider noted above

## 2023-12-26 ENCOUNTER — Telehealth: Payer: Self-pay | Admitting: Gastroenterology

## 2023-12-26 NOTE — Telephone Encounter (Signed)
 PT is calling to let us  know that the Suprep is too expensive and she would like an alternative. Please advise.

## 2023-12-26 NOTE — Telephone Encounter (Signed)
 LM on VM informing pt to try GoodRx or [employer] to get her prep at a discounted price. Also informed via VM of [facility] ($34.85) and [facility] ($47.68) prices with GoodRx coupon. Left office number to call back if she has any questions.   Electronically signed:  Londell River, NCMA

## 2024-01-15 ENCOUNTER — Encounter: Payer: Self-pay | Admitting: Gastroenterology

## 2024-01-15 DIAGNOSIS — G4452 New daily persistent headache (NDPH): Secondary | ICD-10-CM | POA: Diagnosis not present

## 2024-01-15 DIAGNOSIS — G518 Other disorders of facial nerve: Secondary | ICD-10-CM | POA: Diagnosis not present

## 2024-01-15 DIAGNOSIS — M791 Myalgia, unspecified site: Secondary | ICD-10-CM | POA: Diagnosis not present

## 2024-01-15 DIAGNOSIS — M542 Cervicalgia: Secondary | ICD-10-CM | POA: Diagnosis not present

## 2024-01-16 ENCOUNTER — Other Ambulatory Visit: Payer: Self-pay

## 2024-01-30 ENCOUNTER — Encounter: Payer: Self-pay | Admitting: Gastroenterology

## 2024-02-07 ENCOUNTER — Encounter: Payer: Self-pay | Admitting: Gastroenterology

## 2024-02-07 ENCOUNTER — Ambulatory Visit (AMBULATORY_SURGERY_CENTER): Admitting: Gastroenterology

## 2024-02-07 VITALS — BP 119/68 | HR 77 | Temp 97.2°F | Resp 12 | Ht 64.0 in | Wt 206.0 lb

## 2024-02-07 DIAGNOSIS — K648 Other hemorrhoids: Secondary | ICD-10-CM

## 2024-02-07 DIAGNOSIS — Z1211 Encounter for screening for malignant neoplasm of colon: Secondary | ICD-10-CM

## 2024-02-07 DIAGNOSIS — Z860101 Personal history of adenomatous and serrated colon polyps: Secondary | ICD-10-CM | POA: Diagnosis not present

## 2024-02-07 DIAGNOSIS — Z860100 Personal history of colon polyps, unspecified: Secondary | ICD-10-CM

## 2024-02-07 DIAGNOSIS — J45909 Unspecified asthma, uncomplicated: Secondary | ICD-10-CM | POA: Diagnosis not present

## 2024-02-07 DIAGNOSIS — I10 Essential (primary) hypertension: Secondary | ICD-10-CM | POA: Diagnosis not present

## 2024-02-07 MED ORDER — SODIUM CHLORIDE 0.9 % IV SOLN: 500.0000 mL | Freq: Once | INTRAVENOUS | Status: AC

## 2024-02-07 NOTE — Progress Notes (Signed)
 History and Physical:  This patient presents for endoscopic testing for: Encounter Diagnosis  Name Primary?   Hx of colonic polyps Yes    73 year old woman here today for surveillance colonoscopy.  History of colon polyps with 3 tubular adenomas up to 10 mm in diameter last colonoscopy March 2022. I saw her for clinic visit on 12/20/2023 because she had experienced right lower quadrant pain that had almost completely resolved at the time of that visit.  Patient is otherwise without complaints or active issues today.   Past Medical History: Past Medical History:  Diagnosis Date   Allergy    Anemia    Arthritis    Asthma    Chronic headaches    Hypertension    Kidney stones    Pneumonia      Past Surgical History: Past Surgical History:  Procedure Laterality Date   CESAREAN SECTION     COLONOSCOPY     MYOMECTOMY  2005   removed    Allergies: Allergies  Allergen Reactions   Clindamycin/Lincomycin     Chest felt tight, breathing off   Doxycycline  Nausea And Vomiting   Ampicillin Hives   Penicillins Hives   Trileptal  [Oxcarbazepine ] Rash    Outpatient Meds: Current Outpatient Medications  Medication Sig Dispense Refill   aspirin 81 MG tablet Take 81 mg by mouth daily.     baclofen (LIORESAL) 10 MG tablet Take 10 mg by mouth 2 (two) times daily as needed.     cholecalciferol (VITAMIN D3) 25 MCG (1000 UNIT) tablet Take 1,000 Units by mouth daily.     fexofenadine (ALLEGRA) 180 MG tablet Take 180 mg by mouth daily.     fluconazole  (DIFLUCAN ) 150 MG tablet TAKE 1 TABLET EVERY WEEK 12 tablet 3   fluticasone  (FLONASE ) 50 MCG/ACT nasal spray USE TWO SPRAYS IN EACH NOSTRIL ONE TIME DAILY. 48 g 3   meclizine  (ANTIVERT ) 25 MG tablet TAKE 1 TABLET THREE TIMES DAILY AS NEEDED FOR DIZZINESS 100 tablet 3   montelukast  (SINGULAIR ) 10 MG tablet TAKE 1 TABLET (10 MG TOTAL) BY MOUTH AT BEDTIME. 90 tablet 3   Polyethyl Glycol-Propyl Glycol 0.4-0.3 % SOLN Place 1 drop into both eyes  as needed.     potassium chloride  SA (KLOR-CON  M) 20 MEQ tablet Take 1 tablet (20 mEq total) by mouth daily. 90 tablet 3   triamterene -hydrochlorothiazide (MAXZIDE-25) 37.5-25 MG tablet TAKE 1 TABLET EVERY DAY 90 tablet 3   zonisamide (ZONEGRAN) 25 MG capsule Take 100 mg by mouth at bedtime.     albuterol  (VENTOLIN  HFA) 108 (90 Base) MCG/ACT inhaler INHALE 2 PUFFS INTO THE LUNGS EVERY 6 (SIX) HOURS AS NEEDED. 1 each 2   colchicine  0.6 MG tablet Take 1 tablet (0.6 mg total) by mouth 2 (two) times daily as needed. 60 tablet 0   traMADol  (ULTRAM ) 50 MG tablet Take 1 tablet (50 mg total) by mouth every 8 (eight) hours as needed. 30 tablet 0   Current Facility-Administered Medications  Medication Dose Route Frequency Provider Last Rate Last Admin   0.9 %  sodium chloride  infusion  500 mL Intravenous Once Danis, Yarimar Lavis L III, MD          ___________________________________________________________________ Objective   Exam:  BP 137/71   Pulse 81   Temp (!) 97.2 F (36.2 C)   Ht 5' 4 (1.626 m)   Wt 206 lb (93.4 kg)   SpO2 97%   BMI 35.36 kg/m   CV: regular , S1/S2 Resp: clear to auscultation  bilaterally, normal RR and effort noted GI: soft, no tenderness, with active bowel sounds.   Assessment: Encounter Diagnosis  Name Primary?   Hx of colonic polyps Yes     Plan: Colonoscopy   The benefits and risks of the planned procedure(s) were described in detail with the patient or (when appropriate) their health care proxy.  Risks were outlined as including, but not limited to, bleeding, infection, perforation, adverse medication reaction leading to cardiac or pulmonary decompensation, pancreatitis (if ERCP).  The limitation of incomplete mucosal visualization was also discussed.  No guarantees or warranties were given.  The patient is appropriate for an endoscopic procedure in the ambulatory setting.   - Victory Brand, MD

## 2024-02-07 NOTE — Progress Notes (Signed)
 Report to PACU, RN, vss, BBS= Clear.

## 2024-02-07 NOTE — Patient Instructions (Signed)
 YOU HAD AN ENDOSCOPIC PROCEDURE TODAY AT THE [facility]:   Refer to the procedure report that was given to you for any specific questions about what was found during the examination.  If the procedure report does not answer your questions, please call your gastroenterologist to clarify.  If you requested that your care partner not be given the details of your procedure findings, then the procedure report has been included in a sealed envelope for you to review at your convenience later.  YOU SHOULD EXPECT: Some feelings of bloating in the abdomen. Passage of more gas than usual.  Walking can help get rid of the air that was put into your GI tract during the procedure and reduce the bloating. If you had a lower endoscopy (such as a colonoscopy or flexible sigmoidoscopy) you may notice spotting of blood in your stool or on the toilet paper. If you underwent a bowel prep for your procedure, you may not have a normal bowel movement for a few days.  Please Note:  You might notice some irritation and congestion in your nose or some drainage.  This is from the oxygen used during your procedure.  There is no need for concern and it should clear up in a day or so.  SYMPTOMS TO REPORT IMMEDIATELY:  Following lower endoscopy (colonoscopy or flexible sigmoidoscopy):  Excessive amounts of blood in the stool  Significant tenderness or worsening of abdominal pains  Swelling of the abdomen that is new, acute  Fever of 100F or higher   Resume previous diet Continue present medications Repeat colonoscopy in 5 years  For urgent or emergent issues, a gastroenterologist can be reached at any hour by calling [phone]. Do not use MyChart messaging for urgent concerns.    DIET:  We do recommend a small meal at first, but then you may proceed to your regular diet.  Drink plenty of fluids but you should avoid alcoholic beverages for 24 hours.  ACTIVITY:  You should plan to take it easy for the  rest of today and you should NOT DRIVE or use heavy machinery until tomorrow (because of the sedation medicines used during the test).    FOLLOW UP: Our staff will call the number listed on your records the next business day following your procedure.  We will call around 7:15- 8:00 am to check on you and address any questions or concerns that you may have regarding the information given to you following your procedure. If we do not reach you, we will leave a message.     If any biopsies were taken you will be contacted by phone or by letter within the next 1-3 weeks.  Please call us  at [phone] if you have not heard about the biopsies in 3 weeks.    SIGNATURES/CONFIDENTIALITY: You and/or your care partner have signed paperwork which will be entered into your electronic medical record.  These signatures attest to the fact that that the information above on your After Visit Summary has been reviewed and is understood.  Full responsibility of the confidentiality of this discharge information lies with you and/or your care-partner.

## 2024-02-07 NOTE — Op Note (Signed)
 [facility] Patient Name: Madison Stout Procedure Date: 02/07/2024 11:43 AM MRN: [id number] Endoscopist: Victory L. Legrand , MD, [id number] Age: 73 Referring MD:  Date of Birth: 1951/05/24 Gender: Female Account #: [id number] Procedure:                Colonoscopy Indications:              Surveillance: Personal history of adenomatous                            polyps on last colonoscopy 3 years ago                           3 TA up to 10mm March 2022 Medicines:                Monitored Anesthesia Care Procedure:                Pre-Anesthesia Assessment:                           - Prior to the procedure, a History and Physical                            was performed, and patient medications and                            allergies were reviewed. The patient's tolerance of                            previous anesthesia was also reviewed. The risks                            and benefits of the procedure and the sedation                            options and risks were discussed with the patient.                            All questions were answered, and informed consent                            was obtained. Prior Anticoagulants: The patient has                            taken no anticoagulant or antiplatelet agents. ASA                            Grade Assessment: II - A patient with mild systemic                            disease. After reviewing the risks and benefits,                            the patient was deemed in satisfactory condition to  undergo the procedure.                           After obtaining informed consent, the colonoscope                            was passed under direct vision. Throughout the                            procedure, the patient's blood pressure, pulse, and                            oxygen saturations were monitored continuously. The                            Olympus Scope SN: [id number] was introduced  through                            the anus and advanced to the the terminal ileum,                            with identification of the appendiceal orifice and                            IC valve. The colonoscopy was performed without                            difficulty. The patient tolerated the procedure                            well. The quality of the bowel preparation was                            good. The terminal ileum, ileocecal valve,                            appendiceal orifice, and rectum were photographed. Scope In: 11:58:33 AM Scope Out: 12:09:44 PM Scope Withdrawal Time: 0 hours 7 minutes 36 seconds  Total Procedure Duration: 0 hours 11 minutes 11 seconds  Findings:                 The perianal and digital rectal examinations were                            normal.                           The terminal ileum appeared normal.                           Repeat examination of right colon under NBI                            performed.  Internal hemorrhoids were found. The hemorrhoids                            were small.                           The exam was otherwise without abnormality on                            direct and retroflexion views. Complications:            No immediate complications. Estimated Blood Loss:     Estimated blood loss: none. Impression:               - The examined portion of the ileum was normal.                           - Internal hemorrhoids.                           - The examination was otherwise normal on direct                            and retroflexion views.                           - No specimens collected. Recommendation:           - Patient has a contact number available for                            emergencies. The signs and symptoms of potential                            delayed complications were discussed with the                            patient. Return to normal activities tomorrow.                             Written discharge instructions were provided to the                            patient.                           - Resume previous diet.                           - Continue present medications.                           - Repeat colonoscopy in 5 years for surveillance. Nakyra Bourn L. Legrand, MD 02/07/2024 12:12:48 PM This report has been signed electronically.

## 2024-02-08 ENCOUNTER — Telehealth: Payer: Self-pay | Admitting: *Deleted

## 2024-02-08 NOTE — Telephone Encounter (Signed)
  Follow up Call-     02/07/2024   10:40 AM  Call back number  Post procedure Call Back phone  # [phone]  Permission to leave phone message Yes     Patient questions:  Do you have a fever, pain , or abdominal swelling? No. Pain Score  0 *  Have you tolerated food without any problems? Yes.    Have you been able to return to your normal activities? Yes.    Do you have any questions about your discharge instructions: Diet   No. Medications  No. Follow up visit  No.  Do you have questions or concerns about your Care? No.  Actions: * If pain score is 4 or above: No action needed, pain <4.

## 2024-02-23 ENCOUNTER — Other Ambulatory Visit: Payer: Self-pay | Admitting: Internal Medicine

## 2024-03-03 DIAGNOSIS — M542 Cervicalgia: Secondary | ICD-10-CM | POA: Diagnosis not present

## 2024-03-03 DIAGNOSIS — G4452 New daily persistent headache (NDPH): Secondary | ICD-10-CM | POA: Diagnosis not present

## 2024-03-03 DIAGNOSIS — G518 Other disorders of facial nerve: Secondary | ICD-10-CM | POA: Diagnosis not present

## 2024-03-03 DIAGNOSIS — M791 Myalgia, unspecified site: Secondary | ICD-10-CM | POA: Diagnosis not present

## 2024-03-20 NOTE — Telephone Encounter (Signed)
 Opened in error

## 2024-04-04 ENCOUNTER — Other Ambulatory Visit: Payer: Self-pay

## 2024-04-04 MED ORDER — MONTELUKAST SODIUM 10 MG PO TABS: 10.0000 mg | ORAL_TABLET | Freq: Every day | ORAL | 1 refills | Status: AC

## 2024-04-04 NOTE — Telephone Encounter (Signed)
 Rx sent electronically.

## 2024-04-21 ENCOUNTER — Other Ambulatory Visit: Payer: Self-pay

## 2024-04-21 MED ORDER — FLUCONAZOLE 150 MG PO TABS: 150.0000 mg | ORAL_TABLET | ORAL | 0 refills | Status: DC

## 2024-04-28 ENCOUNTER — Other Ambulatory Visit: Payer: Self-pay

## 2024-04-28 MED ORDER — TRIAMTERENE-HCTZ 37.5-25 MG PO TABS: 1.0000 | ORAL_TABLET | Freq: Every day | ORAL | 0 refills | Status: DC

## 2024-06-26 ENCOUNTER — Ambulatory Visit: Payer: Self-pay

## 2024-06-26 NOTE — Telephone Encounter (Signed)
 FYI Only or Action Required?: FYI only for provider: no appointments until next week proceeding to urgent care.  Patient was last seen in primary care on 09/12/2023 by Jimmy Charlie FERNS, MD.  Called Nurse Triage reporting Generalized Body Aches.  Symptoms began a week ago.  Interventions attempted: Rest, hydration, or home remedies and Other: Emergen-C.  Symptoms are: gradually worsening.  Triage Disposition: See Physician Within 24 Hours  Patient/caregiver understands and will follow disposition?: Yes    Copied from CRM #[id number]. Topic: Clinical - Red Word Triage >> Jun 26, 2024  3:43 PM Brittany M wrote: Red Word that prompted transfer to Nurse Triage: Body aches, back pain,chills,headache- runny and stuffy nose- started tuesday Reason for Disposition  Earache  Answer Assessment - Initial Assessment Questions Additional info: No appointments are available in clinic until next week, Rivka plans to proceed to urgent care.   1. ONSET: When did the nasal discharge start?      Tuesday  2. AMOUNT: How much discharge is there?      Scant 3. COUGH: Do you have a cough? If Yes, ask: Describe the color of your mucus. (e.g., clear, white, yellow, green)     Yellow  4. RESPIRATORY DISTRESS: Describe your breathing.      Denies. History of Asthma, no current flair, not needing rescue inhaler 5. FEVER: Do you have a fever? If Yes, ask: What is your temperature, how was it measured, and when did it start?     Denies 6. SEVERITY: Overall, how bad are you feeling right now? (e.g., doesn't interfere with normal activities, staying home from school/work, staying in bed)       7. OTHER SYMPTOMS: Do you have any other symptoms? (e.g., earache, mouth sores, sore throat, wheezing)     Ear pain, all over body aches, headache.  8. PREGNANCY: Is there any chance you are pregnant? When was your last menstrual period?  Protocols used: Common Cold-A-AH

## 2024-06-27 ENCOUNTER — Ambulatory Visit
Admission: RE | Admit: 2024-06-27 | Discharge: 2024-06-27 | Disposition: A | Discharge status: Home / Self Care | Payer: Self-pay | Source: Ambulatory Visit | Attending: Emergency Medicine | Admitting: Emergency Medicine

## 2024-06-27 VITALS — BP 117/78 | HR 99 | Temp 98.0°F | Resp 20

## 2024-06-27 DIAGNOSIS — B349 Viral infection, unspecified: Secondary | ICD-10-CM

## 2024-06-27 MED ORDER — PREDNISONE 10 MG (21) PO TBPK: ORAL_TABLET | Freq: Every day | ORAL | 0 refills | Status: DC

## 2024-06-27 NOTE — ED Triage Notes (Signed)
 Patient complains of cough with yellow sputum, chest pain, headache and back pain x 4 days. Patient hs taken Emergen-C with no relief.  Rates chest pain 2/10, back pain 4/10 and headache 4/10.

## 2024-06-27 NOTE — ED Provider Notes (Signed)
 " [facility]    CSN: [id number] Arrival date & time: 06/27/24  1421      History   Chief Complaint Chief Complaint  Patient presents with   Cough    Upper respiratory - Entered by patient   Chest Pain   Back Pain   Headache    HPI Madison Stout is a 73 y.o. female.   Patient presents for evaluation of chills, generalized bodyaches, nasal congestion, bilateral ear pain and pressure, sore throat, primarily nonproductive cough, intermittent headaches and diarrhea present for 4 days.  Experiencing centralized chest pain and back pain.  Associated sinus pressure occurring intermittently to the bilateral cheeks.  Known exposure to influenza.  Has attempted use of vitamin C.  Tolerable to food and liquids but appetite is decreased.  Denies fever, shortness of breath or wheezing.  History of asthma.      Past Medical History:  Diagnosis Date   Allergy    Anemia    Arthritis    Asthma    Chronic headaches    Hypertension    Kidney stones    Pneumonia     Patient Active Problem List   Diagnosis Date Noted   Abdominal pain 09/12/2023   Toe pain, left 09/08/2022   Stage 3a chronic kidney disease (HCC) 02/10/2022   Goiter 07/15/2021   Intertrigo 07/15/2021   Trigeminal neuralgia of left side of face 02/17/2020   Osteopenia 07/07/2019   Low back pain 06/10/2019   Recurrent sinusitis 07/05/2018   Advance directive discussed with patient 07/05/2018   Generalized osteoarthritis 11/15/2012   Routine general medical examination at a health care facility 12/07/2011   Morbid obesity (HCC) 12/07/2011   Essential hypertension, benign 08/11/2009   Allergic rhinitis due to pollen 04/15/2007   Mild intermittent asthma in adult without complication 04/15/2007    Past Surgical History:  Procedure Laterality Date   CESAREAN SECTION     COLONOSCOPY     MYOMECTOMY  2005   removed    OB History   No obstetric history on file.      Home Medications    Prior to  Admission medications  Medication Sig Start Date End Date Taking? Authorizing Provider  albuterol  (VENTOLIN  HFA) 108 (90 Base) MCG/ACT inhaler INHALE 2 PUFFS INTO THE LUNGS EVERY 6 (SIX) HOURS AS NEEDED. 08/08/23   Jimmy Charlie FERNS, MD  aspirin 81 MG tablet Take 81 mg by mouth daily.    [provider]  baclofen (LIORESAL) 10 MG tablet Take 10 mg by mouth 2 (two) times daily as needed. 12/15/21   [provider]  cholecalciferol (VITAMIN D3) 25 MCG (1000 UNIT) tablet Take 1,000 Units by mouth daily.    [provider]  colchicine  0.6 MG tablet Take 1 tablet (0.6 mg total) by mouth 2 (two) times daily as needed. 09/08/22   Jimmy Charlie FERNS, MD  fexofenadine (ALLEGRA) 180 MG tablet Take 180 mg by mouth daily.    [provider]  fluconazole  (DIFLUCAN ) 150 MG tablet Take 1 tablet (150 mg total) by mouth once a week. TAKE 1 TABLET EVERY WEEK 04/21/24   Webb, Padonda B, FNP  fluticasone  (FLONASE ) 50 MCG/ACT nasal spray USE TWO SPRAYS IN EACH NOSTRIL ONE TIME DAILY. 07/26/23   [name], Richard I, MD  meclizine  (ANTIVERT ) 25 MG tablet TAKE 1 TABLET THREE TIMES DAILY AS NEEDED FOR DIZZINESS 02/25/24   [name], Richard I, MD  montelukast  (SINGULAIR ) 10 MG tablet Take 1 tablet (10 mg total)  by mouth at bedtime. 04/04/24   Webb, Padonda B, FNP  Polyethyl Glycol-Propyl Glycol 0.4-0.3 % SOLN Place 1 drop into both eyes as needed.    [provider]  potassium chloride  SA (KLOR-CON  M) 20 MEQ tablet Take 1 tablet (20 mEq total) by mouth daily. 09/14/23   Jimmy Charlie FERNS, MD  traMADol  (ULTRAM ) 50 MG tablet Take 1 tablet (50 mg total) by mouth every 8 (eight) hours as needed. 07/15/21   Jimmy Charlie FERNS, MD  triamterene -hydrochlorothiazide (MAXZIDE-25) 37.5-25 MG tablet Take 1 tablet by mouth daily. 04/28/24   Webb, Padonda B, FNP  zonisamide (ZONEGRAN) 25 MG capsule Take 100 mg by mouth at bedtime. 11/05/21   [provider]    Family History Family History  Problem  Relation Age of Onset   Dementia Mother    Hypertension Mother    Diabetes Mother    Breast cancer Mother 34   Uterine cancer Mother    Heart disease Father    Hypertension Father    Cancer Sister    Asthma Sister    Parkinson's disease Sister    Other Sister        died at birth   Hypertension Sister    Uterine cancer Maternal Grandmother    Breast cancer Niece 31   Colon cancer Neg Hx    Rectal cancer Neg Hx    Stomach cancer Neg Hx    Esophageal cancer Neg Hx     Social History Social History[1]   Allergies   Clindamycin/lincomycin, Doxycycline , Ampicillin, Penicillins, and Trileptal  [oxcarbazepine ]   Review of Systems Review of Systems  Constitutional:  Positive for chills. Negative for activity change, appetite change, diaphoresis, fatigue, fever and unexpected weight change.  HENT:  Positive for congestion, ear pain and sore throat. Negative for dental problem, drooling, ear discharge, facial swelling, hearing loss, mouth sores, nosebleeds, postnasal drip, rhinorrhea, sinus pressure, sinus pain, sneezing, tinnitus, trouble swallowing and voice change.   Respiratory:  Positive for cough. Negative for apnea, choking, chest tightness, shortness of breath, wheezing and stridor.   Cardiovascular:  Positive for chest pain. Negative for palpitations and leg swelling.  Gastrointestinal:  Positive for diarrhea. Negative for abdominal distention, abdominal pain, anal bleeding, blood in stool, constipation, nausea, rectal pain and vomiting.  Musculoskeletal:  Positive for back pain and myalgias. Negative for arthralgias, gait problem, joint swelling, neck pain and neck stiffness.  Neurological:  Positive for headaches. Negative for dizziness, tremors, seizures, syncope, facial asymmetry, speech difficulty, weakness, light-headedness and numbness.     Physical Exam Triage Vital Signs ED Triage Vitals  Encounter Vitals Group     BP 06/27/24 1444 117/78     Girls Systolic BP  Percentile --      Girls Diastolic BP Percentile --      Boys Systolic BP Percentile --      Boys Diastolic BP Percentile --      Pulse Rate 06/27/24 1444 99     Resp 06/27/24 1444 20     Temp 06/27/24 1444 98 F (36.7 C)     Temp Source 06/27/24 1444 Oral     SpO2 06/27/24 1444 99 %     Weight --      Height --      Head Circumference --      Peak Flow --      Pain Score 06/27/24 1440 2     Pain Loc --      Pain Education --  Exclude from Growth Chart --    No data found.  Updated Vital Signs BP 117/78 (BP Location: Left Arm)   Pulse 99   Temp 98 F (36.7 C) (Oral)   Resp 20   SpO2 99%   Visual Acuity Right Eye Distance:   Left Eye Distance:   Bilateral Distance:    Right Eye Near:   Left Eye Near:    Bilateral Near:     Physical Exam Constitutional:      Appearance: Normal appearance.  HENT:     Head: Normocephalic.     Right Ear: Tympanic membrane, ear canal and external ear normal.     Left Ear: Tympanic membrane, ear canal and external ear normal.     Nose: Congestion present.     Mouth/Throat:     Pharynx: No oropharyngeal exudate or posterior oropharyngeal erythema.  Eyes:     Extraocular Movements: Extraocular movements intact.  Cardiovascular:     Rate and Rhythm: Normal rate and regular rhythm.     Pulses: Normal pulses.     Heart sounds: Normal heart sounds.  Pulmonary:     Effort: Pulmonary effort is normal.     Breath sounds: Normal breath sounds.  Neurological:     Mental Status: She is alert and oriented to person, place, and time. Mental status is at baseline.      UC Treatments / Results  Labs (all labs ordered are listed, but only abnormal results are displayed) Labs Reviewed  POC COVID19/FLU A&B COMBO    EKG   Radiology No results found.  Procedures Procedures (including critical care time)  Medications Ordered in UC Medications - No data to display  Initial Impression / Assessment and Plan / UC Course  I have  reviewed the triage vital signs and the nursing notes.  Pertinent labs & imaging results that were available during my care of the patient were reviewed by me and considered in my medical decision making (see chart for details).  Viral illness  Vital signs stable, patient in no signs of distress nontoxic-appearing, lungs clear to auscultation, O2 saturation 99% on room air, declined COVID and flu testing, symptoms present for 4 days, etiology most likely viral affecting asthma, prescribed prednisone , declined prescription cough medicine due to cost, recommend over-the-counter medications and nonpharmacological supportive care and advised follow-up for any persisting or worsening symptoms Final Clinical Impressions(s) / UC Diagnoses   Final diagnoses:  Viral illness   Discharge Instructions   None    ED Prescriptions   None    PDMP not reviewed this encounter.     [1]  Social History Tobacco Use   Smoking status: Never    Passive exposure: Never   Smokeless tobacco: Never  Vaping Use   Vaping status: Never Used  Substance Use Topics   Alcohol use: No    Alcohol/week: 0.0 standard drinks of alcohol   Drug use: No     Teresa Shelba SAUNDERS, NP 06/27/24 1554  "

## 2024-06-27 NOTE — Discharge Instructions (Addendum)
 Your symptoms today are most likely being caused by a virus and should steadily improve in time it can take up to 7 to 10 days before you truly start to see a turnaround however things will get better  You have declined COVID and flu testing, if you change your mind you may do a home test that can be found at a local pharmacy  Begin  prednisone  every morning with food to help reduce inflammation, will help with chest and back pain and keep the airway open and relax    For cough: honey 1/2 to 1 teaspoon (you can dilute the honey in water or another fluid).  You can also use guaifenesin and dextromethorphan for cough. You can use a humidifier for chest congestion and cough.  If you don't have a humidifier, you can sit in the bathroom with the hot shower running.      For sore throat: try warm salt water gargles, cepacol lozenges, throat spray, warm tea or water with lemon/honey, popsicles or ice, or OTC cold relief medicine for throat discomfort.   For congestion: take a daily anti-histamine like Zyrtec, Claritin, and a oral decongestant, such as pseudoephedrine.  You can also use Flonase  1-2 sprays in each nostril daily.   It is important to stay hydrated: drink plenty of fluids (water, gatorade/powerade/pedialyte, juices, or teas) to keep your throat moisturized and help further relieve irritation/discomfort.

## 2024-07-11 ENCOUNTER — Other Ambulatory Visit: Payer: Self-pay | Admitting: Family

## 2024-07-16 ENCOUNTER — Other Ambulatory Visit: Payer: Self-pay | Admitting: Family

## 2024-07-31 ENCOUNTER — Ambulatory Visit

## 2024-07-31 VITALS — BP 100/64 | HR 76 | Temp 97.8°F | Ht 64.25 in | Wt 198.0 lb

## 2024-07-31 DIAGNOSIS — Z113 Encounter for screening for infections with a predominantly sexual mode of transmission: Secondary | ICD-10-CM

## 2024-07-31 DIAGNOSIS — I10 Essential (primary) hypertension: Secondary | ICD-10-CM

## 2024-07-31 DIAGNOSIS — N1831 Chronic kidney disease, stage 3a: Secondary | ICD-10-CM

## 2024-07-31 DIAGNOSIS — M159 Polyosteoarthritis, unspecified: Secondary | ICD-10-CM

## 2024-07-31 DIAGNOSIS — L989 Disorder of the skin and subcutaneous tissue, unspecified: Secondary | ICD-10-CM

## 2024-07-31 DIAGNOSIS — Z136 Encounter for screening for cardiovascular disorders: Secondary | ICD-10-CM

## 2024-07-31 DIAGNOSIS — Z131 Encounter for screening for diabetes mellitus: Secondary | ICD-10-CM

## 2024-07-31 DIAGNOSIS — M85852 Other specified disorders of bone density and structure, left thigh: Secondary | ICD-10-CM

## 2024-07-31 DIAGNOSIS — E876 Hypokalemia: Secondary | ICD-10-CM

## 2024-07-31 LAB — CBC WITH DIFFERENTIAL/PLATELET
Basophils Absolute: 0 K/uL (ref 0.0–0.1)
Basophils Relative: 1.1 % (ref 0.0–3.0)
Eosinophils Absolute: 0.1 K/uL (ref 0.0–0.7)
Eosinophils Relative: 2.4 % (ref 0.0–5.0)
HCT: 41.4 % (ref 36.0–46.0)
Hemoglobin: 13.4 g/dL (ref 12.0–15.0)
Lymphocytes Relative: 40.1 % (ref 12.0–46.0)
Lymphs Abs: 1.5 K/uL (ref 0.7–4.0)
MCHC: 32.5 g/dL (ref 30.0–36.0)
MCV: 79.8 fl (ref 78.0–100.0)
Monocytes Absolute: 0.3 K/uL (ref 0.1–1.0)
Monocytes Relative: 6.7 % (ref 3.0–12.0)
Neutro Abs: 1.9 K/uL (ref 1.4–7.7)
Neutrophils Relative %: 49.7 % (ref 43.0–77.0)
Platelets: 210 K/uL (ref 150.0–400.0)
RBC: 5.18 Mil/uL — ABNORMAL HIGH (ref 3.87–5.11)
RDW: 14.6 % (ref 11.5–15.5)
WBC: 3.8 K/uL — ABNORMAL LOW (ref 4.0–10.5)

## 2024-07-31 LAB — LIPID PANEL
Cholesterol: 211 mg/dL — ABNORMAL HIGH (ref 28–200)
HDL: 55.8 mg/dL
LDL Cholesterol: 125 mg/dL — ABNORMAL HIGH (ref 10–99)
NonHDL: 154.72
Total CHOL/HDL Ratio: 4
Triglycerides: 147 mg/dL (ref 10.0–149.0)
VLDL: 29.4 mg/dL (ref 0.0–40.0)

## 2024-07-31 LAB — BASIC METABOLIC PANEL WITH GFR
BUN: 15 mg/dL (ref 6–23)
CO2: 26 meq/L (ref 19–32)
Calcium: 10 mg/dL (ref 8.4–10.5)
Chloride: 103 meq/L (ref 96–112)
Creatinine, Ser: 1.07 mg/dL (ref 0.40–1.20)
GFR: 52.14 mL/min — ABNORMAL LOW
Glucose, Bld: 94 mg/dL (ref 70–99)
Potassium: 3.7 meq/L (ref 3.5–5.1)
Sodium: 139 meq/L (ref 135–145)

## 2024-07-31 LAB — VITAMIN D 25 HYDROXY (VIT D DEFICIENCY, FRACTURES): VITD: 33.51 ng/mL (ref 30.00–100.00)

## 2024-07-31 LAB — HEMOGLOBIN A1C: Hgb A1c MFr Bld: 6.4 % (ref 4.6–6.5)

## 2024-07-31 LAB — PHOSPHORUS: Phosphorus: 3.7 mg/dL (ref 2.3–4.6)

## 2024-07-31 NOTE — Patient Instructions (Addendum)
 Thank you for visiting [facility] today! Here's what we talked about: - START Calcium-vitamin D  combo supplement: Nature's bounty/Caltrate/Calcitrate. STOP Vitamin D  supplement Daily calcium dose 1200mg , Daily Vitamin D  dose 1000units  - START walking/pilates 30 mins at least 3 times weekly  - STOP aspirin

## 2024-07-31 NOTE — Progress Notes (Unsigned)
 "  Subjective:   This visit was conducted in person. The patient gave informed consent to the use of Abridge AI technology to record the contents of the encounter as documented below.   Patient ID: Madison Stout, female    DOB: 1950/12/21, 74 y.o.   MRN: [id number]   Discussed the use of AI scribe software for clinical note transcription with the patient, who gave verbal consent to proceed.  History of Present Illness       Review of Systems      Allergies[1]  Medications Ordered Prior to Encounter[2]  BP 100/64 (BP Location: Right Arm, Patient Position: Sitting, Cuff Size: Large)   Pulse 76   Temp 97.8 F (36.6 C) (Oral)   Ht 5' 4.25 (1.632 m)   Wt 198 lb (89.8 kg)   SpO2 97%   BMI 33.72 kg/m   Objective:      Physical Exam          Assessment & Plan:   Assessment and Plan Assessment & Plan       No follow-ups on file.   Semiyah Newgent K Terrance Lanahan, MD  07/31/24     Contains text generated by Abridge.       [1]  Allergies Allergen Reactions   Clindamycin/Lincomycin Other (See Comments)    Chest felt tight, breathing off   Doxycycline  Nausea And Vomiting   Ampicillin Hives   Penicillins Hives   Trileptal  [Oxcarbazepine ] Rash  [2]  Current Outpatient Medications on File Prior to Visit  Medication Sig Dispense Refill   albuterol  (VENTOLIN  HFA) 108 (90 Base) MCG/ACT inhaler INHALE 2 PUFFS INTO THE LUNGS EVERY 6 (SIX) HOURS AS NEEDED. 1 each 2   aspirin 81 MG tablet Take 81 mg by mouth daily.     baclofen (LIORESAL) 10 MG tablet Take 10 mg by mouth 2 (two) times daily as needed.     cholecalciferol (VITAMIN D3) 25 MCG (1000 UNIT) tablet Take 1,000 Units by mouth daily.     colchicine  0.6 MG tablet Take 1 tablet (0.6 mg total) by mouth 2 (two) times daily as needed. 60 tablet 0   fexofenadine (ALLEGRA) 180 MG tablet Take 180 mg by mouth daily.     fluconazole  (DIFLUCAN ) 150 MG tablet Take 1 tablet (150 mg total) by mouth once a week. TAKE 1 TABLET EVERY  WEEK 12 tablet 0   fluticasone  (FLONASE ) 50 MCG/ACT nasal spray USE TWO SPRAYS IN EACH NOSTRIL ONE TIME DAILY. 48 g 3   meclizine  (ANTIVERT ) 25 MG tablet TAKE 1 TABLET THREE TIMES DAILY AS NEEDED FOR DIZZINESS 100 tablet 3   montelukast  (SINGULAIR ) 10 MG tablet Take 1 tablet (10 mg total) by mouth at bedtime. 90 tablet 1   Polyethyl Glycol-Propyl Glycol 0.4-0.3 % SOLN Place 1 drop into both eyes as needed.     potassium chloride  SA (KLOR-CON  M) 20 MEQ tablet Take 1 tablet (20 mEq total) by mouth daily. 90 tablet 3   predniSONE  (STERAPRED UNI-PAK 21 TAB) 10 MG (21) TBPK tablet Take by mouth daily. Take 6 tabs by mouth daily  for 1 days, then 5 tabs for 1 days, then 4 tabs for 1 days, then 3 tabs for 1 days, 2 tabs for 1 days, then 1 tab by mouth daily for 1 days 21 tablet 0   traMADol  (ULTRAM ) 50 MG tablet Take 1 tablet (50 mg total) by mouth every 8 (eight) hours as needed. 30 tablet 0   triamterene -hydrochlorothiazide (MAXZIDE-25) 37.5-25 MG tablet TAKE  1 TABLET EVERY DAY 90 tablet 3   zonisamide (ZONEGRAN) 25 MG capsule Take 100 mg by mouth at bedtime.     Current Facility-Administered Medications on File Prior to Visit  Medication Dose Route Frequency Provider Last Rate Last Admin   0.9 %  sodium chloride  infusion  500 mL Intravenous Once Danis, Henry L III, MD       "

## 2024-08-01 LAB — PTH, INTACT AND CALCIUM
Calcium: 10.2 mg/dL (ref 8.6–10.4)
PTH: 28 pg/mL (ref 16–77)

## 2024-08-01 LAB — IRON,TIBC AND FERRITIN PANEL
%SAT: 21 % (ref 16–45)
Ferritin: 186 ng/mL (ref 16–288)
Iron: 70 ug/dL (ref 45–160)
TIBC: 332 ug/dL (ref 250–450)

## 2024-08-01 LAB — HEPATITIS C ANTIBODY: Hepatitis C Ab: NONREACTIVE

## 2024-08-05 ENCOUNTER — Other Ambulatory Visit: Payer: Self-pay

## 2024-08-05 NOTE — Telephone Encounter (Signed)
 Last OV 122-26 Next OV 09-16-24 [facility]

## 2024-08-06 MED ORDER — MECLIZINE HCL 25 MG PO TABS: ORAL_TABLET | ORAL | 3 refills | Status: AC

## 2024-08-08 ENCOUNTER — Ambulatory Visit: Payer: Self-pay

## 2024-08-12 ENCOUNTER — Encounter: Payer: Self-pay | Admitting: Podiatry

## 2024-08-12 ENCOUNTER — Other Ambulatory Visit: Payer: Self-pay

## 2024-08-12 ENCOUNTER — Ambulatory Visit: Admitting: Podiatry

## 2024-08-12 VITALS — Ht 64.25 in | Wt 198.0 lb

## 2024-08-12 DIAGNOSIS — D2372 Other benign neoplasm of skin of left lower limb, including hip: Secondary | ICD-10-CM | POA: Diagnosis not present

## 2024-08-12 NOTE — Telephone Encounter (Signed)
 Copied from CRM #[id number]. Topic: Clinical - Medication Refill >> Aug 11, 2024  2:07 PM Mesmerise C wrote: Medication: potassium chloride  SA (KLOR-CON  M) 20 MEQ tablet  Has the patient contacted their pharmacy? Yes (Agent: If no, request that the patient contact the pharmacy for the refill. If patient does not wish to contact the pharmacy document the reason why and proceed with request.) (Agent: If yes, when and what did the pharmacy advise?)  This is the patient's preferred pharmacy:  [facility] - [place], [place] - [address] [place] [address] Phone: [phone] Fax: [phone]  Is this the correct pharmacy for this prescription? Yes If no, delete pharmacy and type the correct one.   Has the prescription been filled recently? No  Is the patient out of the medication? No  Has the patient been seen for an appointment in the last year OR does the patient have an upcoming appointment? Yes  Can we respond through MyChart? Yes  Agent: Please be advised that Rx refills may take up to 3 business days. We ask that you follow-up with your pharmacy.

## 2024-08-13 MED ORDER — POTASSIUM CHLORIDE CRYS ER 20 MEQ PO TBCR: 20.0000 meq | EXTENDED_RELEASE_TABLET | Freq: Every day | ORAL | 3 refills | Status: AC

## 2024-09-11 ENCOUNTER — Ambulatory Visit

## 2024-09-16 ENCOUNTER — Encounter
